# Patient Record
Sex: Female | Born: 1980
Health system: Southern US, Community
[De-identification: ages and names within clinical notes are randomized; demographics above are authoritative.]

## PROBLEM LIST (undated history)

## (undated) DIAGNOSIS — R32 Unspecified urinary incontinence: Secondary | ICD-10-CM

## (undated) DIAGNOSIS — I471 Supraventricular tachycardia, unspecified: Secondary | ICD-10-CM

## (undated) DIAGNOSIS — R000 Tachycardia, unspecified: Secondary | ICD-10-CM

## (undated) DIAGNOSIS — K589 Irritable bowel syndrome without diarrhea: Secondary | ICD-10-CM

## (undated) DIAGNOSIS — K900 Celiac disease: Secondary | ICD-10-CM

## (undated) DIAGNOSIS — T7840XA Allergy, unspecified, initial encounter: Secondary | ICD-10-CM

## (undated) DIAGNOSIS — B2790 Infectious mononucleosis, unspecified without complication: Secondary | ICD-10-CM

## (undated) DIAGNOSIS — E7889 Other lipoprotein metabolism disorders: Secondary | ICD-10-CM

## (undated) DIAGNOSIS — F419 Anxiety disorder, unspecified: Secondary | ICD-10-CM

## (undated) DIAGNOSIS — M26609 Unspecified temporomandibular joint disorder, unspecified side: Secondary | ICD-10-CM

## (undated) HISTORY — DX: Tachycardia, unspecified: R00.0

## (undated) HISTORY — DX: Other lipoprotein metabolism disorders: E78.89

## (undated) HISTORY — DX: Supraventricular tachycardia, unspecified: I47.10

## (undated) HISTORY — PX: TOOTH EXTRACTION: SUR596

## (undated) HISTORY — PX: LAPAROSCOPY: SHX197

## (undated) HISTORY — DX: Infectious mononucleosis, unspecified without complication: B27.90

## (undated) HISTORY — PX: ABDOMINOPLASTY: SUR9

## (undated) HISTORY — PX: COSMETIC SURGERY: SHX468

## (undated) HISTORY — DX: Supraventricular tachycardia: I47.1

## (undated) HISTORY — DX: Allergy, unspecified, initial encounter: T78.40XA

## (undated) HISTORY — DX: Unspecified urinary incontinence: R32

## (undated) HISTORY — DX: Unspecified temporomandibular joint disorder, unspecified side: M26.609

## (undated) HISTORY — DX: Irritable bowel syndrome, unspecified: K58.9

## (undated) HISTORY — DX: Anxiety disorder, unspecified: F41.9

## (undated) HISTORY — DX: Celiac disease: K90.0

---
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
## 1998-08-27 ENCOUNTER — Encounter: Payer: Self-pay | Admitting: Emergency Medicine

## 1998-08-27 ENCOUNTER — Emergency Department (HOSPITAL_COMMUNITY): Admission: EM | Admit: 1998-08-27 | Discharge: 1998-08-27 | Payer: Self-pay | Admitting: Emergency Medicine

## 2000-07-01 ENCOUNTER — Other Ambulatory Visit: Admission: RE | Admit: 2000-07-01 | Discharge: 2000-07-01 | Payer: Self-pay | Admitting: *Deleted

## 2000-09-20 ENCOUNTER — Emergency Department (HOSPITAL_COMMUNITY): Admission: EM | Admit: 2000-09-20 | Discharge: 2000-09-21 | Payer: Self-pay | Admitting: Emergency Medicine

## 2000-10-22 ENCOUNTER — Encounter (INDEPENDENT_AMBULATORY_CARE_PROVIDER_SITE_OTHER): Payer: Self-pay

## 2000-10-23 ENCOUNTER — Inpatient Hospital Stay (HOSPITAL_COMMUNITY): Admission: RE | Admit: 2000-10-23 | Discharge: 2000-10-24 | Payer: Self-pay | Admitting: *Deleted

## 2001-01-20 ENCOUNTER — Ambulatory Visit (HOSPITAL_COMMUNITY): Admission: RE | Admit: 2001-01-20 | Discharge: 2001-01-20 | Payer: Self-pay | Admitting: Gastroenterology

## 2007-07-21 ENCOUNTER — Other Ambulatory Visit: Admission: RE | Admit: 2007-07-21 | Discharge: 2007-07-21 | Payer: Self-pay | Admitting: Internal Medicine

## 2007-11-25 ENCOUNTER — Ambulatory Visit (HOSPITAL_COMMUNITY): Admission: RE | Admit: 2007-11-25 | Discharge: 2007-11-25 | Payer: Self-pay | Admitting: Obstetrics and Gynecology

## 2007-11-25 ENCOUNTER — Encounter (INDEPENDENT_AMBULATORY_CARE_PROVIDER_SITE_OTHER): Payer: Self-pay | Admitting: Obstetrics and Gynecology

## 2008-12-14 ENCOUNTER — Inpatient Hospital Stay (HOSPITAL_COMMUNITY): Admission: AD | Admit: 2008-12-14 | Discharge: 2008-12-14 | Payer: Self-pay | Admitting: Obstetrics and Gynecology

## 2008-12-15 ENCOUNTER — Inpatient Hospital Stay (HOSPITAL_COMMUNITY): Admission: AD | Admit: 2008-12-15 | Discharge: 2008-12-19 | Payer: Self-pay | Admitting: Obstetrics and Gynecology

## 2010-10-08 ENCOUNTER — Encounter
Admission: RE | Admit: 2010-10-08 | Discharge: 2010-12-18 | Payer: Self-pay | Source: Home / Self Care | Attending: Obstetrics and Gynecology | Admitting: Obstetrics and Gynecology

## 2011-03-04 LAB — COMPREHENSIVE METABOLIC PANEL
ALT: 20 U/L (ref 0–35)
AST: 21 U/L (ref 0–37)
Albumin: 2.7 g/dL — ABNORMAL LOW (ref 3.5–5.2)
Albumin: 2.8 g/dL — ABNORMAL LOW (ref 3.5–5.2)
Alkaline Phosphatase: 91 U/L (ref 39–117)
Alkaline Phosphatase: 99 U/L (ref 39–117)
BUN: 9 mg/dL (ref 6–23)
BUN: 9 mg/dL (ref 6–23)
CO2: 23 mEq/L (ref 19–32)
Calcium: 9 mg/dL (ref 8.4–10.5)
Chloride: 104 mEq/L (ref 96–112)
Creatinine, Ser: 0.47 mg/dL (ref 0.4–1.2)
Creatinine, Ser: 0.59 mg/dL (ref 0.4–1.2)
GFR calc Af Amer: >60 mL/min (ref >60)
GFR calc non Af Amer: >60 mL/min (ref >60)
Glucose, Bld: 111 mg/dL — ABNORMAL HIGH (ref 70–99)
Glucose, Bld: 70 mg/dL (ref 70–99)
Potassium: 3.7 mEq/L (ref 3.5–5.1)
Sodium: 135 mEq/L (ref 135–145)
Total Bilirubin: 0.2 mg/dL — ABNORMAL LOW (ref 0.3–1.2)
Total Bilirubin: 0.5 mg/dL (ref 0.3–1.2)
Total Protein: 5.8 g/dL — ABNORMAL LOW (ref 6.0–8.3)
Total Protein: 6.2 g/dL (ref 6.0–8.3)

## 2011-03-04 LAB — CBC
HCT: 37 % (ref 36.0–46.0)
HCT: 38.9 % (ref 36.0–46.0)
Hemoglobin: 10.4 g/dL — ABNORMAL LOW (ref 12.0–15.0)
Hemoglobin: 12.3 g/dL (ref 12.0–15.0)
Hemoglobin: 12.5 g/dL (ref 12.0–15.0)
Hemoglobin: 13.1 g/dL (ref 12.0–15.0)
MCHC: 33.5 g/dL (ref 30.0–36.0)
MCHC: 33.8 g/dL (ref 30.0–36.0)
MCHC: 33.8 g/dL (ref 30.0–36.0)
MCV: 92.1 fL (ref 78.0–100.0)
MCV: 92.1 fL (ref 78.0–100.0)
Platelets: 202 10*3/uL (ref 150–400)
Platelets: 230 10*3/uL (ref 150–400)
RBC: 3.33 MIL/uL — ABNORMAL LOW (ref 3.87–5.11)
RBC: 4.22 MIL/uL (ref 3.87–5.11)
RDW: 14.1 % (ref 11.5–15.5)
RDW: 14.1 % (ref 11.5–15.5)
RDW: 14.3 % (ref 11.5–15.5)
WBC: 12.6 10*3/uL — ABNORMAL HIGH (ref 4.0–10.5)
WBC: 8.9 10*3/uL (ref 4.0–10.5)

## 2011-03-04 LAB — LACTATE DEHYDROGENASE
LDH: 105 U/L (ref 94–250)
LDH: 135 U/L (ref 94–250)

## 2011-03-04 LAB — URIC ACID
Uric Acid, Serum: 4.6 mg/dL (ref 2.4–7.0)
Uric Acid, Serum: 5 mg/dL (ref 2.4–7.0)

## 2011-03-04 LAB — RPR: RPR Ser Ql: NONREACTIVE

## 2011-04-02 ENCOUNTER — Inpatient Hospital Stay (HOSPITAL_COMMUNITY)
Admission: AD | Admit: 2011-04-02 | Discharge: 2011-04-02 | Disposition: A | Discharge status: Home / Self Care | Payer: 59 | Source: Intra-hospital | Attending: Obstetrics and Gynecology | Admitting: Obstetrics and Gynecology

## 2011-04-02 DIAGNOSIS — O99891 Other specified diseases and conditions complicating pregnancy: Secondary | ICD-10-CM | POA: Insufficient documentation

## 2011-04-02 DIAGNOSIS — R000 Tachycardia, unspecified: Secondary | ICD-10-CM | POA: Insufficient documentation

## 2011-04-02 LAB — COMPREHENSIVE METABOLIC PANEL
ALT: 10 U/L (ref 0–35)
AST: 15 U/L (ref 0–37)
Alkaline Phosphatase: 82 U/L (ref 39–117)
CO2: 24 mEq/L (ref 19–32)
Chloride: 99 mEq/L (ref 96–112)
Glucose, Bld: 95 mg/dL (ref 70–99)
Potassium: 3.9 mEq/L (ref 3.5–5.1)
Sodium: 135 mEq/L (ref 135–145)
Total Bilirubin: 0.2 mg/dL — ABNORMAL LOW (ref 0.3–1.2)

## 2011-04-02 LAB — CBC
Hemoglobin: 12.7 g/dL (ref 12.0–15.0)
MCH: 30.5 pg (ref 26.0–34.0)
MCHC: 33.2 g/dL (ref 30.0–36.0)
Platelets: 173 10*3/uL (ref 150–400)
RDW: 13.7 % (ref 11.5–15.5)

## 2011-04-02 LAB — TSH: TSH: 1.747 u[IU]/mL (ref 0.350–4.500)

## 2011-04-02 LAB — URIC ACID: Uric Acid, Serum: 4.5 mg/dL (ref 2.4–7.0)

## 2011-04-02 NOTE — Discharge Summary (Signed)
NAMEJATAYA, WANN                ACCOUNT NO.:  [id number]   MEDICAL RECORD NO.:  [id number]          PATIENT TYPE:  INP   LOCATION:  9101                          FACILITY:  [place]   PHYSICIAN:  Daleen Bo. Gaetano Net, M.D. DATE OF BIRTH:  [birth year]   DATE OF ADMISSION:  12/15/2008  DATE OF DISCHARGE:  12/19/2008                               DISCHARGE SUMMARY   ADMITTING DIAGNOSES:  1. Intrauterine pregnancy at term.  2. Pregnancy-induced hypertension.  3. Induction of labor.   DISCHARGE DIAGNOSES:  1. Status post low transverse cesarean section secondary to failure to      progress.  2. Viable female infant.   PROCEDURE:  Primary low transverse cesarean section.   REASON FOR ADMISSION:  Please see dictated H and [place]:  The patient is 30 year old, gravida 2, para 0 that was  admitted to [facility] for an induction of labor.  The  patient had been seen in the office.  Her blood pressure was 134/90.  [place] labs were within normal limits.  The patient was complaining of a  headache and decision was made to admit the patient for an induction of  labor.  On admission, vital signs were stable.  Blood pressure 140/80.  Deep tendon reflexes are 2+, no clonus.  The patient was given Cytotec  for softening of her cervix and Pitocin was started the following  morning.  The patient had been in a good pattern of labor.  The  following morning, she did receive epidural for her comfort.  That  evening, cervix was 2 cm, 90% effaced with vertex revealing some edema  at -1 station.  Given that, there had not been any change in cervix in  greater than 9 hours.  Decision was made to proceed with a cesarean  delivery.  The patient was then transferred to the operating room where  epidural was dosed to an adequate surgical level.  A low transverse  incision was made with delivery of a viable female infant, weighing 8  pounds with Apgars of 9 at 1 minute and 9 at 5 minutes.   The patient  tolerated the procedure well and was taken to the recovery room in  stable condition.  In postoperative day #1, the patient was without  complaint.  Vital signs were stable.  She was afebrile.  Abdomen was  soft.  Fundus firm and nontender.  Abdominal dressings was noted to be  clean, dry, and intact.  Laboratory findings revealed a hemoglobin of  12.3, platelet count of 197,000, and WBC of 13.8.  On postoperative day  #2, the patient was without complaint.  Vital signs remained stable.  She is afebrile.  Fundus firm and nontender.  Abdominal dressing had  been removed to revealing an incision that was clean, dry, and intact.  Laboratory findings showed hemoglobin of 10.4, platelet count 176,000,  and WBC count of 12.6.  On postoperative day #3, the patient was without  complaint.  Vital signs remained stable.  She was afebrile.  Fundus firm  and nontender.  Incision was clean,  dry, and intact.  Staples removed.  The patient was later discharged home.   CONDITION ON DISCHARGE:  Stable.   DIET:  Regular as tolerated.   ACTIVITY:  No heavy lifting, no driving x2 weeks, and no vaginal entry.   FOLLOWUP:  The patient is to follow up in the office in 1-2 weeks for an  incision check.  She is to call for temperature greater than 100  degrees, persistent nausea, vomiting, heavy vaginal bleeding and/or  redness, or drainage from the incisional site.   DISCHARGE MEDICATIONS:  1. Tylox #30 one p.o. every 4-6 hours p.r.n.  2. Motrin 600 mg every 6 hours.  3. Prenatal vitamins 1 p.o. daily.  4. Colace 1 p.o. daily p.r.n.      Juanda Chance, N.P.      Daleen Bo. Gaetano Net, M.D.  Electronically Signed    CC/MEDQ  D:  12/19/2008  T:  12/19/2008  Job:  [id number]

## 2011-04-02 NOTE — Op Note (Signed)
[name], Terry [name]                ACCOUNT NO.:  [id number]   MEDICAL RECORD NO.:  [id number]          PATIENT TYPE:  INP   LOCATION:  9101                          FACILITY:  [place]   PHYSICIAN:  Michelle L. Grewal, M.D.DATE OF BIRTH:  February 06, 1981   DATE OF PROCEDURE:  DATE OF DISCHARGE:                               OPERATIVE REPORT   PREOPERATIVE DIAGNOSES:  Intrauterine pregnancy at term and failure to  progress.   POSTOPERATIVE DIAGNOSES:  Intrauterine pregnancy at term and failure to  progress.   PROCEDURE:  Primary low transverse cesarean section.   SURGEON:  Michelle L. Helane Rima, M.D.   ANESTHESIA:  Epidural.   FINDINGS:  Female infant, cephalic presentation Apgars 9 at 1 minute and 9  at 5 minutes.   ESTIMATED BLOOD LOSS:  500 mL.   COMPLICATIONS:  None.   PROCEDURE:  The patient was taken to the operating room.  The patient  was prepped and draped after her epidural was dosed.  A Foley catheter  was already and had been inserted while on labor and delivery.  Low  transverse incision was made, carried down to the fascia.  Fascia scored  in the midline and extended laterally.  The rectus muscles were  separated in the midline.  The peritoneum was entered bluntly.  The  peritoneal incision was then stretched.  The bladder blade was inserted.  The lower uterine segment was identified and the bladder flap was  created sharply and then digitally.  The bladder blade was readjusted.  A low transverse incision was made in the uterus.  The uterus was  entered using a hemostat.  The baby was in cephalic presentation and was  in occiput posterior position.  We delivered easily with a vacuum  extractor was a female infant Apgars 9 at 1 minute and 9 at 5 minutes.  The cord was clamped and cut the baby was handed to the awaiting  pediatricians and taken to the nursery.  The placenta was manually  removed, noted be normal intact with three-vessel cord.  Uterus was  exteriorized and  cleared of all clots and debris.  The uterine incision  was closed in two layers using 0-Chromic in a running locked stitch.  Hemostasis was excellent.  The uterus was returned to the abdomen.  Irrigation was performed.  Hemostasis was again noted.  The peritoneum  was closed using 0-Vicryl and running stitch.  The rectus muscles were  reapproximated using 0-Vicryl and running stitch.  After irrigation of  the subcutaneous layer, the skin was closed with staples.  All sponge,  lap, and instrument counts were correct x2.  The patient went to  recovery room in stable condition.     Michelle L. Helane Rima, M.D.  Electronically Signed    MLG/MEDQ  D:  12/16/2008  T:  12/17/2008  Job:  [id number]

## 2011-04-02 NOTE — H&P (Signed)
NAMEMARIANNA, CID NO.:  [id number]   MEDICAL RECORD NO.:  [id number]          PATIENT TYPE:  INP   LOCATION:  9173                          FACILITY:  [facility]   PHYSICIAN:  Darlyn Chamber, M.D.   DATE OF BIRTH:  Nov 07, 1981   DATE OF ADMISSION:  12/15/2008  DATE OF DISCHARGE:                              HISTORY & PHYSICAL   The patient is a 30 year old gravida 2, para 0 female.  Last menstrual  period of Terry [name] 29, 2009, with an estimated date confinement of December 21, 2008, and an estimated gestational at 73 weeks and 1/7th.  She was  seen in the office yesterday.  Her blood pressure 134/90, was sent to  triage.  Her PIH panel was normal.  She was monitored.  The baby was  reactive.  Blood pressure seemed to settle down.  She presented back  today complaining of worsening headaches.  Her blood pressure is 140/80  with a trace of protein.  In view of the worsening headache since an  elevated blood pressure near-term, decision was to bring in for Cytotec  induction of labor.  Her group B strep is negative.   ALLERGIES:  No known drug allergies.   MEDICATIONS:  Prenatal vitamins.   PRENATAL LABS:  An O positive, negative antibody screen, nonreactive  serology, immune rubella titer.  Negative hepatitis B surface antigen,  nonreactive HIV.   For past medical history, family history, and social history, please see  prenatal records.   REVIEW OF SYSTEMS:  Noncontributory.   PHYSICAL EXAMINATION:  VITAL SIGNS:  The patient's blood pressure  140/80.  Other vital signs are stable.  LUNGS:  Clear.  CARDIAC SYSTEM:  Regular rhythm rate, grade 2/6 systolic ejection  murmur.  ABDOMEN:  Gravid.  Uterus consistent with dates.  PELVIC:  Cervix is long and closed.  Vertex is presenting.  Pelvimetry  is adequate.  EXTREMITIES:  Deep tendon reflexes 2+.  No clonus.  Revealed 2+ to 3+  edema.   Ultrasound revealed intrauterine pregnancy with an estimated fetal  weight  of 8 pounds 5 ounces.  Amniotic fluid was 15.7 cm, 63rd  percentile, and foot was vertex.   IMPRESSION:  Intrauterine pregnancy at 39 plus weeks with pregnancy-  induced hypertension.   PLAN:  The patient undergo Cytotec ripening of cervix and induction.  Risk of induction have been discussed.     Darlyn Chamber, M.D.  Electronically Signed    JSM/MEDQ  D:  12/15/2008  T:  12/16/2008  Job:  [id number]

## 2011-04-02 NOTE — Op Note (Signed)
Terry [name], DARIS                ACCOUNT NO.:  [id number]   MEDICAL RECORD NO.:  [id number]          PATIENT TYPE:  AMB   LOCATION:  [place]                           FACILITY:  [place]   PHYSICIAN:  Daleen Bo. Gaetano Net, M.D. DATE OF BIRTH:  1981/05/06   DATE OF PROCEDURE:  11/25/2007  DATE OF DISCHARGE:                               OPERATIVE REPORT   PREOPERATIVE DIAGNOSIS:  Spontaneous abortion.   POSTOPERATIVE DIAGNOSIS:  Spontaneous abortion.   PROCEDURE:  1. Dilatation evacuation.  2. 1% Xylocaine paracervical block.   SURGEON:  Everlene Farrier, MD   ANESTHESIA:  MAC.   SPECIMENS:  Products of conception to pathology.   ESTIMATED BLOOD LOSS:  Minimal.   INDICATIONS AND CONSENT:  The patient is a 30 year old married white  female G1 P0 with a quantitative HCG that went from approximately 42,000-  43,000.  Ultrasound x2 has revealed an intrauterine gestational sac with  the yolk sac but no fetal pole.  Diagnosis of spontaneous abortion is  made.  Options were discussed and dilatation evacuation is reviewed.  Potential risks and complications were reviewed preoperatively including  but limited to infection, uterine perforation, organ damage, bleeding  requiring transfusion of blood products with possible HIV and hepatitis  acquisition, intrauterine synechia, secondary infertility, hysterectomy.  All questions were answered and consent is signed on the chart.   PROCEDURE:  The patient taken to operating room where she is placed in  dorsosupine position and given intravenous sedation.  She is then placed  in the dorsal lithotomy position where she is gently prepped and draped  in sterile fashion.  The patient had voided immediately prior to  transfer to the operating room.  Bivalve speculum is placed in the  vagina and anterior cervical lip was injected with 1% plain Xylocaine.  It was then grasped with a single-tooth tenaculum.  Paracervical block  was placed at 2, 4, 5, 7, 8 and  10 o'clock positions with approximately  20 mL of the same solution.  Cervix was gently progressively dilated to  29 dilator.  #7 curved curettes placed in the uterine cavity and suction  curettage is carried out for obvious products of conception.  After the  first pass of the suction curette 20 units of Pitocin were added to a  new bag of IV fluids.  Alternating sharp and suction curettage is  carried out until the cavity is clean.  Good hemostasis is noted.  All  instruments are removed and all counts correct.  The patient is  discharged home on Methergine 0.2 mg p.o. t.i.d. for six doses.  Blood  type is O+.  Follow-up in the office in 2 weeks.      Daleen Bo Gaetano Net, M.D.  Electronically Signed     JET/MEDQ  D:  11/25/2007  T:  11/25/2007  Job:  [id number]

## 2011-04-05 ENCOUNTER — Encounter: Payer: Self-pay | Admitting: Internal Medicine

## 2011-04-05 NOTE — Op Note (Signed)
[facility]  Patient:    Terry [name], Terry [name]                     MRN: [id number] Proc. Date: 10/22/00 Adm. Date:  [id number] Attending:  Syble Creek B                           Operative Report  PREOPERATIVE DIAGNOSES:       1. Dysmenorrhea.                               2. Dyspareunia.                               3. Possible endometriosis.  POSTOPERATIVE DIAGNOSES:      Endometrioma involving right ovary and                               posterior cul-de-sac.  OPERATIONS:                   1.  Diagnostic laparoscopy.                               2.  Laparotomy.                               3.  Exploration of small and large intestine,                                   excision of endometriosis.  SURGEON:                      Syble Creek, M.D.  ASSISTANTS:                   Sheronette A. Cousins M.D.                               Lovenia Kim, M.D.  INTRAOPERATIVE CONSULT:       Dr. Maia Plan. Lindon Romp, M.D., General Surgery.  ANESTHESIA:                   General.  ESTIMATED BLOOD LOSS:         150 cc.  IV FLUIDS:                    2500 cc.  URINE OUTPUT:                 300 cc.  COMPLICATIONS:                None.  DRAINS:                       Foley.  INDICATIONS:                  This 30 year old white female has a long history of bilateral lower quadrant pain, dysmenorrhea, dyspareunia. Leading diagnosis prior to surgery was endometriosis. Note: Patient was exquisitely tender in t he cul-de-sac.  She was taken to surgery for diagnostic laparoscopy for definitive diagnosis and possible treatment of endometriosis if diagnosed.  FINDINGS:                     1. On open laparoscopy there was concern for                                  bowel injury given a dark matter which                                  appeared  consistent with feces.                               2. On exploratory laparotomy, no injury to             large or small bowel, stomach, or upper                                  abdomen.                               3. Endometriosis of the cul-de-sac (excised) and                                  presumed right ovarian endometrioma.  DESCRIPTION OF PROCEDURE:     The patient was taken to operating room and general anesthesia obtained. She was placed in the ski position and prepped and draped in standard fashion. Foley catheter inserted into the bladder. Single-tooth tenaculum attached to the anterior lip of the cervix and the Corson cannula attached to the single-tooth.  Attention turned to the abdomen and a 10-mm vertical infraumbilical skin fold incision was begun with a knife. The underlying fascia was grasped with Kocher clamps and elevated. It was entered sharply with a knife and a pursestring suture was placed with 0 Vicryl. The peritoneum was identified, elevated with Allis clamps and entered sharply. Intra-abdominal placement was confirmed by palpation with an examining finger. The Hasson cannula was then inserted into the abdomen and secured to the previously placed pursestring suture.  On placement of the operative scope, intra-abdominal placement could not be confirmed visually. Therefore, the scope was removed and Hasson cannula removed. On the tip of Hasson cannula was a dark material that appeared consistent with feces. Also had odor consistent with feces. Given this, I was concerned for injury to the bowel. General surgery consultation was obtained.  With Dr. Ellwood Sayers assistance a vertical midline incision was extended inferiorly to just above the symphysis pubis. This was carried sharply to the underlying fascia. The fascia was divided with a knife. The underlying peritoneum was divided with the Bovie. Trendelenburg position was obtained. Balfour retractor inserted into the abdomen. The small bowel was run from ligament of Treitz to the terminal ileum. No  injuries noted. The colon was run from the cecum around to the sigmoid colon and rectum. No injuries were noted. Dr. Lindon Romp from general surgery was present for the examination of the bowel. He concurred there were no injuries visualized. Please see his intraoperative dictation  for further details from his perspective.  The bowel was then packed superiorly with moist packs and the pelvis inspected. The right ovary had similar-type material on the posterior surface as had previously been noted on the tip of the Hasson cannula. An area appeared to be consistent with a ruptured endometrioma was seen. This portion of the ovary was opened and no further cyst wall could be delineated. This area was closed with a running stitch of 3-0 Vicryl. Another small simple cyst had ruptured on manipulation of the ovary which was made hemostatic with a Bovie. The right tube, uterus, left tube, and ovary all appeared normal. In the posterior cul-de-sac an area consistent with endometriosis was seen. It was elevated with long Allis clamps and this peritoneal excised sharply. It was approximately 3-cm long x 3-cm wide area. This was then closed with a running stitch of 3-0 Vicryl. Hemostasis obtained. Pelvis was re-irrigated and was hemostatic. Packs were removed from the abdomen and bowel returned to an anatomic position.  The fascia was closed in a running stitch of #1 PDS. The subcutaneous tissue was irrigated, made hemostatic with a Bovie. The skin was closed in a running subcuticular stitch of 4-0 Vicryl and covered with Steri-Strips.  The instruments were removed from the vagina and the anterior lip of the cervix was noted to be bleeding. It was made hemostatic with a single figure-of-eight suture of 3-0 chromic. The patient tolerated the procedure well with no known complications. She was taken to recovery room awake, alert, and is stable condition. DD:  10/22/00 TD:  10/22/00 Job:  [id number]

## 2011-04-05 NOTE — Procedures (Signed)
[name]  Patient:    Terry [name], STROHM Visit Number: [id number] MRN: [id number]          Service Type: GYN Location: [phone] Attending Physician:  Shelah Lewandowsky Proc. Date: 01/20/01 Admit Date:  10/22/2000 Discharge Date: 10/24/2000   CC:         Syble Creek, M.D., [facility]                           Procedure Report  DATE OF BIRTH:  Sep 25, 1981.  PROCEDURE:  Flexible sigmoidoscopy up to 80 cm.  ENDOSCOPIST:  Nelwyn Salisbury, M.D.  INSTRUMENT USED:  Olympus video colonoscope.  INDICATION FOR PROCEDURE:  Left lower quadrant pain and a history of IBS in a 29 year old white female, who underwent a recent laparoscopic evaluation which had to be done through open procedure because of questionable stool on the trocar and the question of a perforated diverticulum.  Rule out diverticulosis.  PREPROCEDURE PREPARATION:  Informed consent was procured from the patient. The patient was fasted for eight hours prior to the procedure and prepped with two Fleets enemas the morning of the procedure.  PREPROCEDURE PHYSICAL:  VITAL SIGNS:  The patient had stable vital signs.  NECK:  Supple.  CHEST:  Clear to auscultation.  S1, S2 regular.  No murmur, rub, or gallop, rales, rhonchi, or wheezing.  ABDOMEN:  Soft with normal abdominal bowel sounds.  DESCRIPTION OF PROCEDURE:  The patient was placed in the left lateral decubitus position and sedated with 100 mg of Demerol and 10 mg of Versed intravenously.  Once the patient was adequately sedate and maintained on low-flow oxygen and continuous cardiac monitoring, the Olympus video colonoscope was advanced from the rectum to about 80 cm without difficulty. The patient tolerated the procedure well without complication.  No masses, polyps, erosions, ulcerations, or diverticulosis were seen.  IMPRESSION:  Normal flexible sigmoidoscopy up to 80 cm.  RECOMMENDATIONS: 1. The patient is  advised to continue increasing the fluid and fiber in her    diet and use Levsin on a p.r.n. basis for spasm. 2. Outpatient follow-up is advised in the next four weeks. Attending Physician:  Syble Creek B DD:  01/20/01 TD:  01/20/01 Job: [id number]

## 2011-04-05 NOTE — Discharge Summary (Signed)
[facility]  Patient:    Terry [name], Terry [name] Visit Number: [id number] MRN: [id number]          Service Type: GYN Location: [phone] Attending Physician:  Shelah Lewandowsky Admit Date:  10/22/2000 Discharge Date: 10/24/2000                             Discharge Summary  ADMISSION DIAGNOSES:          1. Bilateral lower quadrant abdominal                                  pain.                               2. Dyspareunia.                               3. Dysmenorrhea.  DISCHARGE DIAGNOSES:          Probable ruptured diverticulum in the remote past with subsequent abdominal pain.  HISTORY OF PRESENT ILLNESS:   For complete details, please see the history and physical in the chart. Briefly, the patient presented with a two-and-a-half year history of bilateral lower quadrant pain increasing in severity. Had been worse on the left side than the right. This had been premenstrual as well as with intercourse. She also had associated dysmenorrhea. Patient presented for diagnostic laparoscopy, potential treatment of endometriosis.  HOSPITAL COURSE:              On day of admission, patient underwent diagnostic laparoscopy. On entry to the abdomen, there was concern for bowel injury due to the presence of what appeared to be fecal material.  Therefore, the abdomen was opened and explored with Dr. Lindon Romp, of general surgery, appeared no bowel injury could be identified. There was a brownish material that did appear to be consistent with stool. This was submitted to pathology. In addition, an area of the posterior cul-de-sac that was thickened and inflamed was submitted as well. Otherwise, the patients surgery was unremarkable and she was stable throughout.  Postoperatively, patient regained her ability to ambulate and void rapidly. She did have some postoperative nausea on the first day and a mild ileus. However, by the second postoperative day she was  passing flatus and tolerating regular diet. In addition, she remained afebrile with stable vital signs. She was discharged on second postoperative day in satisfactory condition.  Of note, on the day of discharge, I was notified by Dr. Marval Regal of pathology of his observations from the submitted specimens. First, the presumed fecal material was confirmed to be just that. By his report it appeared to be old fecal material due to the fact that there were hemosiderin deposits noted within the material which would not be consistent with a fresh stool specimen. The area of peritoneum that was excised from the cul-de-sac simply showed some change associated with inflammation and hemosiderin. His presumptive diagnosis from the material submitted would be that of an old perforated diverticulum which had since healed. No evidence of endometriosis was seen by pathology.  DISCHARGE INSTRUCTIONS:       Patient instructed to avoid heavy lifting; no driving for two weeks. Report nausea, vomiting, abdominal pain, or pain medicine not working. Also watch  incisions for signs of infection.  FOLLOWUP:                     Follow up [place] in four weeks. At that time I will refer patient to gastroenterologist for further recommendations regarding her diagnosis of diverticulosis. In the meantime patient is instructed to avoid those types of foods that could be irritating to diverticulosis including peas, seeds, nuts, etc. Also instructed patient to avoid constipation by using Metamucil daily.  DISCHARGE MEDICATIONS:        1. Tylox one to two tablets every four to six                                  hours as needed for pain.                               2. Ortho Tri-Cyclen for contraception. Patient                                  instructed to start this after her next                                  menstrual period. Attending Physician:  Syble Creek B DD:  10/24/00 TD:  10/25/00 Job:  [id number]

## 2011-04-05 NOTE — Consult Note (Signed)
[facility]  Patient:    Terry [name], Terry [name]                     MRN: [id number] Adm. Date:  [id number] Disc. Date: [id number] Attending:  Maudry Diego                          Consultation Report  I was called into the operating room by Dr. Rosana Hoes because of a specimen seen at the tip of a Hasson cannula which had a slightly stoolish appearance.  He had already started a laparotomy in the midline and this was extended superiorly and inferiorly.  There was some blood in the pelvis and this was sucked dry.  We immediately saw some endometriosis, particularly on the right ovary and the right mesovarium and some endometriosis in the cul-de-sac.  The small intestine was run from the ligament of Treitz to the bloodless _____________.  The duodenum appeared intact. There was no bleeding or bile staining in the mesentery.  The anterior surface of the stomach and duodenum felt normal.  The rectum, sigmoid colon, ascending colon, transverse colon, descending colon and cecum all appeared normal.  The appendix was intact and without signs of inflammation.  Dr. Rosana Hoes and I and his assistant examined the intestine several times until we were satisfied that we could find no perforation or any evidence of perforation.  We believe that the specimen represents old blood from the endometrioma.  The patient was stable when I left the operating room and Dr. Rosana Hoes was continuing with his procedure. DD:  10/22/00 TD:  10/22/00 Job: [id number]

## 2011-04-05 NOTE — H&P (Signed)
[place]  Patient:    Terry [name], SCHLICHTING                       MRN: [id number] Adm. Date:  10/22/00 Attending:  Dr. Syble Creek Dictator:   WD                         History and Physical  DATE OF BIRTH:  02-11-1981  ANTICIPATED ADMISSION DATE:   October 22, 2000  ADMISSION DIAGNOSES:          1.  Bilateral lower quadrant abdominal                                   pain.                               2.  Dyspareunia.                               3.  Dysmenorrhea.                               4.  Possible endometriosis.  HISTORY OF PRESENT ILLNESS:   Ms. Luberta Mutter is a 30 year old white female, gravida 0, who presents with a 2-1/2 year history of bilateral lower quadrant pain which has been increasing in severity.  It occurs primarily just premenstrual as well as with intercourse.  Her menses are also painful and she says she has cramps, however, not as bad as after intercourse or just prior to menses.  She has been treated in the past for irritable bowel disease with Levsin and Lorcet, however, this has not relieved her symptoms.  A few weeks ago she was seen at [facility] for increasing severe abdominal pain.  No other diagnosis have been rendered at this time.  The patient is presenting for a definitive diagnosis of her bilateral lower abdominal pain and associated dysmenorrhea and dyspareunia as well as potential treatment of endometriosis if identified.  PAST MEDICAL HISTORY:         Irritable bowel syndrome, migraines, mononucleosis, and TMJ.  PAST SURGICAL HISTORY:        Wisdom teeth.  GYN HISTORY:                  Last pap smear 8/01 within normal limits.  MEDICATIONS:                  1.  Prevacid.                               2.  Lorcet.  ALLERGIES:                    None.  SOCIAL HISTORY:               The patient is a nonsmoker; occasional alcohol. No history of domestic violence.  The patient is single  and lives with her parents and works as a Corporate treasurer.  FAMILY HISTORY:               Significant for hypertension and cardiovascular disease in parents and  grandparents.  REVIEW OF SYSTEMS:            CONSTITUTIONAL:  Constitutional history of fainting spell or near syncopal episode associated with pelvic pain recently. HEENT:  Eyes are negative.  No problems with ears or hearing.  She does have occasional nose bleeds prior to menses.  BACK:  She has pain in her back below the scapula bilaterally.  CARDIOVASCULAR:  Within normal limits.  RESPIRATORY:  No cough or shortness of breath.  GASTROINTESTINAL:  No nausea, vomiting, constipation, blood in stools or diarrhea.  She does have occasional heart burn.  GENITOURINARY:  See HPI.  MUSCULOSKELETAL:  No severe joint or muscle pains.  SKIN/BREASTS:  No new lesions or breast masses.  NEUROLOGICAL:  She has a history of migraine headaches.  PSYCHIATRIC:  No work or family problems, no history of domestic violence, or history of sexual assault. ENDOCRINE:  No hot flashes, thyroid disease, or diabetes.  HEMATOLOGIC:  No unexplained bruising or easy bleeding.  PHYSICAL EXAMINATION:  VITAL SIGNS:                  Blood pressure 120/82.  Pulse 102.  Weight: 123.  Height:  57.5".  GENERAL:                      Generally, the patient is alert and oriented and in no acute distress.  SKIN:                         Skin is warm and dry with no lesions.  CARDIAC:                      Heart is regular rate and rhythm.  LUNGS:                        The lungs are clear to auscultation.  ABDOMEN:                      Naval ring noted.  No hernia.  Liver and spleen is normal.  Mild bilateral lower quadrant tenderness.  LYMPHATICS:                   Lymph node survey is negative.  Axilla, neck groin, pelvic exam was performed on 10/03/00 by Dr. Georgana Curio and not repeated prior to surgery.  GYNECOLOGIC:                  Per Dr. Berneta Sages,  normal external female genitalia. Vagina and cervix is normal.  The uterus is a normal size. Anteriorly, no adnexal masses or tenderness.  GENITORECTAL:                 The urethra, bladder base, and anus are normal. Rectovaginal exam shows exquisite tenderness in the cul-de-sac.  ASSESSMENT:                   1.  Bilateral lower quadrant pain.                               2.  Dysmenorrhea.                               3.  Dyspareunia.  4.  Reading diagnosis is endometriosis.  PLAN:                         Laparoscopy for possible ways for vaporization of endometriosis lesions.  It was explained to the patient that there is a possibility that no pathological diagnosis will be seen.  Also it was explained to the patient that severe endometriosis may be found which could not be addressed via laparoscopy and there may be a need for additional future surgery.  Operative risks were discussed including infection, bleeding, damage to bowel,  bladder or surrounding organs.  All questions were answered.  No guarantees given.  Arrangements have been made for surgery on December 5th at [facility]. DD:  10/16/00 TD:  10/16/00 Job: [id number]

## 2011-04-05 NOTE — Op Note (Signed)
[facility]  Patient:    Terry [name]                      MRN: [id number] Proc. Date: 10/22/00 Attending:  Frederico Hamman, M.D.                           Operative Report  PREOPERATIVE DIAGNOSIS:       Multiparity.  PROCEDURE:                    Postpartum tubal ligation.  SURGEON:                      Frederico Hamman, M.D.  DESCRIPTION OF PROCEDURE:     Using MAC anesthesia, with the patient in the supine position, the abdomen was prepped and draped.  The bladder was emptied with a straight catheter.  A midline subumbilical incision one inch long was made and carried down through the fascia.  The fascia was clean.  Grasped with two Kochers, and the fascia of the peritoneum opened.  The left tube was grasped in the midportion with a Babcock clamp.  The tube was traced to the fimbria.  A #0 plain suture was placed in the mesosalpinx below the portion of the tube that was clamped, and the tube tied, and approximately one inch of tube transected.  Hemostasis was satisfactory.  We proceeded in a similar fashion on the other side.  The abdomen was closed in layers.  The peritoneum and fascia closed with a continuous suture of #0 Dexon.  The skin was closed with subcuticular suture of #3-0 plain.  The patient tolerated the procedure well and was taken to the recovery room in good condition. DD:  10/22/00 TD:  10/22/00 Job: [id number]

## 2011-04-08 ENCOUNTER — Ambulatory Visit (INDEPENDENT_AMBULATORY_CARE_PROVIDER_SITE_OTHER): Payer: 59 | Admitting: Internal Medicine

## 2011-04-08 ENCOUNTER — Institutional Professional Consult (permissible substitution): Payer: 59 | Admitting: Internal Medicine

## 2011-04-08 DIAGNOSIS — IMO0002 Reserved for concepts with insufficient information to code with codable children: Secondary | ICD-10-CM | POA: Insufficient documentation

## 2011-04-08 DIAGNOSIS — I498 Other specified cardiac arrhythmias: Secondary | ICD-10-CM

## 2011-04-08 DIAGNOSIS — G901 Familial dysautonomia [Riley-Day]: Secondary | ICD-10-CM

## 2011-04-08 DIAGNOSIS — Z789 Other specified health status: Secondary | ICD-10-CM

## 2011-04-08 DIAGNOSIS — G909 Disorder of the autonomic nervous system, unspecified: Secondary | ICD-10-CM

## 2011-04-08 DIAGNOSIS — I471 Supraventricular tachycardia, unspecified: Secondary | ICD-10-CM | POA: Insufficient documentation

## 2011-04-08 DIAGNOSIS — R000 Tachycardia, unspecified: Secondary | ICD-10-CM | POA: Insufficient documentation

## 2011-04-08 NOTE — Assessment & Plan Note (Signed)
As above.

## 2011-04-08 NOTE — Assessment & Plan Note (Signed)
The patient is having significant symptoms related to orthostatic intolerance in the setting of a long-standing history of orthostatic intolerance and dysautonomia. The third trimester with its large uterus is likely the precipitant of the worsening of her symptoms. Fluid depletion is aggravating. She's had problems with blood pressure at the terminal portions of her pregnancy and so salt repletion at this point is not appropriate. She is to be delivered next week. This will relieve the IVC "obstruction" and hopefully mitigate a lot of her symptoms. Volume repletion at the time of her delivery will be important. Continue her beta blockers at this point seems reasonable.  I have reviewed extensively the physiology of orthostatic intolerance with the patient. We will plan to see her again 6-8 weeks following delivery further review this in the commode longer-term plans.

## 2011-04-08 NOTE — Assessment & Plan Note (Signed)
>>  ASSESSMENT AND PLAN FOR SINUS TACHYCARDIA WRITTEN ON 04/08/2011 12:40 PM BY Duke Salvia, MD  As above

## 2011-04-08 NOTE — Progress Notes (Signed)
HPI: Terry [name] is a 30 y.o. female Is seen at the request of Dr. Gaetano Net because of tachycardia and presyncope occurring now in the third trimester of pregnancy.  She is a 30 year old woman with a long-standing history of orthostatic intolerance, shower intolerance, Jacuzzi intolerance who in the last trimester of her second pregnancy has had significant problems with presyncope. It has been accompanied by a prodrome of nausea flushing tinnitus and visual changes. It is relieved by lying down and exacerbated by standing.  Her diet is salt and water deplete.  She was started on atenolol a few weeks ago by her OB/GYN with a marked improvement in her heart rate having gone from recorded rates of 140s or so down to thw 110s.  I anticipate that her thyroid and her hemoglobin have all been checked and are normal. Current Outpatient Prescriptions  Medication Sig Dispense Refill  . acetaminophen (TYLENOL) 325 MG tablet Take 650 mg by mouth every 6 (six) hours as needed.        Marland Kitchen atenolol (TENORMIN) 25 MG tablet Take 25 mg by mouth daily.        . folic acid (FOLVITE) 030 MCG tablet Take 400 mcg by mouth daily.        . NON FORMULARY otc nasal spray mixed with saline solution as needed       . Prenatal Vit-Fe Sulfate-FA (PRENATAL VITAMIN PO) 1 tab po qd       . ranitidine (ZANTAC) 150 MG tablet       . TiZANidine HCl (ZANAFLEX PO) 1 tab po bid       . HYDROcodone-acetaminophen (VICODIN) 5-500 MG per tablet As needed        No Known Allergies  Past Medical History  Diagnosis Date  . Tachycardia   . Celiac disease   . IBS (irritable bowel syndrome)   . Migraine   . Mononucleosis   . TMJ disease     Past Surgical History  Procedure Date  . Cesarean section   . Laparoscopy   . Tooth extraction     Family History  Problem Relation Age of Onset  . Hypertension      History   Social History  . Marital Status: Married    Spouse Name: N/A    Number of Children: N/A  . Years of  Education: N/A   Occupational History  . Not on file.   Social History Main Topics  . Smoking status: Not on file  . Smokeless tobacco: Not on file  . Alcohol Use: Not on file  . Drug Use: Not on file  . Sexually Active: Not on file   Other Topics Concern  . Not on file   Social History Narrative  . No narrative on file    Fourteen point review of systems was negative except as noted in HPI and PMH   PHYSICAL EXAMINATION  Blood pressure 136/94, pulse 115.   Well developed and nourished Terry [name] Caucasian female appearing her stated agein no acute distress HENT normal Neck supple with JVP-flat Carotids brisk and full without bruits Back without scoliosis or kyphosis Clear Regular rate and rhythm, no murmurs or gallops Abd-gravid distal pulses intact No Clubbing cyanosis edema Skin-warm and dry LN-neg submandibular and supraclavicular A & Oriented CN 3-12 normal  Grossly normal sensory and motor function Affect engaging . Normal sinus rhythm at 103 Intervals 0.13/0.07/0.33 Axis is 30

## 2011-04-08 NOTE — Patient Instructions (Signed)
Your physician recommends that you schedule a follow-up appointment in: 2 months.  Your physician recommends that you continue on your current medications as directed. Please refer to the Current Medication list given to you today.

## 2011-04-09 ENCOUNTER — Encounter (HOSPITAL_COMMUNITY): Payer: 59

## 2011-04-09 ENCOUNTER — Other Ambulatory Visit: Payer: Self-pay | Admitting: Obstetrics and Gynecology

## 2011-04-09 ENCOUNTER — Telehealth: Payer: Self-pay | Admitting: Internal Medicine

## 2011-04-09 LAB — CBC
HCT: 39.1 % (ref 36.0–46.0)
MCHC: 33 g/dL (ref 30.0–36.0)
Platelets: 178 10*3/uL (ref 150–400)
RDW: 13.8 % (ref 11.5–15.5)
WBC: 7.9 10*3/uL (ref 4.0–10.5)

## 2011-04-09 LAB — SURGICAL PCR SCREEN: MRSA, PCR: NEGATIVE

## 2011-04-09 NOTE — Telephone Encounter (Signed)
LOV,12 faxed to [facility] @  [phone]  04/09/11/km

## 2011-04-11 ENCOUNTER — Telehealth: Payer: Self-pay | Admitting: *Deleted

## 2011-04-11 NOTE — Telephone Encounter (Signed)
See other comments on this phone note.

## 2011-04-12 ENCOUNTER — Telehealth: Payer: Self-pay | Admitting: Internal Medicine

## 2011-04-12 NOTE — Telephone Encounter (Signed)
Pt had appointment  on Monday- Dr. Caryl Comes was suppose to write a letter to [facility]. Pt having her baby on Tuesday. Deadline is today.

## 2011-04-12 NOTE — Telephone Encounter (Signed)
I spoke with the pt and per Nira Conn RN documentation on 04/11/11 the pt's 04/08/11 office note was faxed to [facility].  The pt requested that a copy of this office note be mailed to her home.  This was placed in the out going mail today.

## 2011-04-16 ENCOUNTER — Other Ambulatory Visit: Payer: Self-pay | Admitting: Obstetrics and Gynecology

## 2011-04-16 ENCOUNTER — Inpatient Hospital Stay (HOSPITAL_COMMUNITY)
Admission: RE | Admit: 2011-04-16 | Discharge: 2011-04-19 | DRG: 766 | Disposition: A | Discharge status: Home / Self Care | Payer: 59 | Source: Ambulatory Visit | Attending: Obstetrics and Gynecology | Admitting: Obstetrics and Gynecology

## 2011-04-16 DIAGNOSIS — Z01812 Encounter for preprocedural laboratory examination: Secondary | ICD-10-CM

## 2011-04-16 DIAGNOSIS — Z01818 Encounter for other preprocedural examination: Secondary | ICD-10-CM

## 2011-04-16 DIAGNOSIS — O34219 Maternal care for unspecified type scar from previous cesarean delivery: Principal | ICD-10-CM | POA: Diagnosis present

## 2011-04-16 DIAGNOSIS — O99892 Other specified diseases and conditions complicating childbirth: Secondary | ICD-10-CM | POA: Diagnosis present

## 2011-04-17 LAB — CBC
Hemoglobin: 10.4 g/dL — ABNORMAL LOW (ref 12.0–15.0)
MCH: 30.1 pg (ref 26.0–34.0)
MCHC: 32.6 g/dL (ref 30.0–36.0)
RDW: 14 % (ref 11.5–15.5)

## 2011-04-18 NOTE — Op Note (Signed)
Terry [name], Terry [name]                ACCOUNT NO.:  [id number]  MEDICAL RECORD NO.:  [id number]           PATIENT TYPE:  I  LOCATION:  9105                          FACILITY:  [place]  PHYSICIAN:  Daleen Bo. Gaetano Net, M.D. DATE OF BIRTH:  11-Sep-1981  DATE OF PROCEDURE:  04/16/2011 DATE OF DISCHARGE:                              OPERATIVE REPORT   PREOPERATIVE DIAGNOSES: 1. Intrauterine pregnancy at 39-1/7 weeks' estimated additional age at 30. 2. Previous cesarean section, desires repeat.  POSTOPERATIVE DIAGNOSIS: 1. Intrauterine pregnancy at 39-1/7 weeks' estimated additional age at 30. 2. Previous cesarean section, desires repeat.  PROCEDURE:  Repeat low transverse cesarean section.  SURGEON:  Daleen Bo. Gaetano Net, MD  ANESTHESIA:  Spinal.  SPECIMENS:  Placenta to pathology.  ESTIMATED BLOOD LOSS:  700 mL.  FINDINGS:  Viable female infant, Apgars of 8 and 9 at 1 and 5 minutes respectively.  Birth weight pending.  Intrauterine cord pH 7.39.  INDICATIONS AND CONSENT:  This patient is a 30 year old married white female G2, P1 with previous cesarean section at 39-1/7 weeks.  Prenatal care has been complicated by maternal tachycardia in the 140s.  This was treated with atenolol 25 mg daily.  Evaluation by Cardiology is consistent with a dysautonomic hyperactivity or dysautonomic syndrome. Recommendations are to continue atenolol now and through the postpartum period until postpartum Cardiology evaluation.  We will also maintain adequate hydration.  Potential risks and complications of the cesarean section had been discussed with the patient preoperatively including but limited to infection, organ damage, bleeding requiring transfusion of blood products with HIV and hepatitis acquisition, DVT, PE, and pneumonia.  All questions have been answered and consent was signed on the chart.  PROCEDURE:  The patient was taken to operating room where she is identified.  Spinal anesthetic is placed and she is  placed in dorsal supine position with a 50-degree left lateral wedge.  She is then prepped with Betadine.  Foley catheter was placed in the bladder to drain and she is draped in a sterile fashion.  After testing for adequate spinal anesthesia, skin is entered through a Pfannenstiel incision and the old scar is resected on the way in.  Dissection is carried out in layers to the peritoneum, which was taken down and extended superiorly and inferiorly.  Vesicouterine peritoneum was taken down cephalolaterally and the bladder flap was developed and the bladder blade was placed.  Uterus was incised in a low transverse manner and the uterine cavity was entered bluntly with a hemostat.  The uterine incision is extended cephalolaterally with the fingers.  Clear fluid was noted.  In order to elevate the vertex through the incision which contained some scarring from the original C-section, vacuum extractor was placed on the vertex.  With one gentle lift with no pop off, the vertex easily delivers.  Remainder of the baby is delivered.  Good cry and tone is noted.  Cord was clamped and cut.  The baby is handed to awaiting pediatrics team.  Placenta is manually delivered and sent to pathology.  Uterine cavity is clean.  Uterus is closed in 2 running locking imbricating layers of 0 Monocryl  suture.  Additional figure-of- eight was placed at the left angle and good hemostasis was noted.  Tubes and ovaries are normal bilaterally.  Anterior peritoneum was closed in a running fashion with 0 Monocryl suture, which was also used to reapproximate the pyramidalis muscle in midline.  Anterior rectus fascia was closed in running fashion with a 0 looped PDS suture.  Skin was closed with clips.  All sponge, instrument, and needle counts were correct and the patient was taken to recovery room in stable condition.     Daleen Bo Gaetano Net, M.D.     JET/MEDQ  D:  04/16/2011  T:  04/17/2011  Job:   [id number]  Electronically Signed by Everlene Farrier M.D. on 04/18/2011 01:15:39 PM

## 2011-04-21 ENCOUNTER — Inpatient Hospital Stay (HOSPITAL_COMMUNITY): Admission: AD | Admit: 2011-04-21 | Payer: Self-pay | Source: Home / Self Care | Admitting: Obstetrics and Gynecology

## 2011-05-14 NOTE — Discharge Summary (Signed)
Terry [name], Terry [name]                ACCOUNT NO.:  [id number]  MEDICAL RECORD NO.:  [id number]           PATIENT TYPE:  I  LOCATION:  9105                          FACILITY:  [facility]  PHYSICIAN:  Darlyn Chamber, M.D.   DATE OF BIRTH:  1981/04/09  DATE OF ADMISSION:  04/16/2011 DATE OF DISCHARGE:  04/19/2011                              DISCHARGE SUMMARY   ADMITTING DIAGNOSES: 1. Intrauterine pregnancy at 73 and 1/2 weeks' estimated gestational     age. 2. Previous cesarean section, desires repeat.  DISCHARGE DIAGNOSES: 1. Status post low transverse cesarean section. 2. Viable female infant.  PROCEDURE:  Repeat low transverse cesarean section.  REASON FOR ADMISSION:  Please see dictated H and P.  HOSPITAL COURSE:  The patient is a 30 year old white married female gravida 2, para 1 that was admitted to [facility] at 58- 1/2 weeks' estimated gestational age for scheduled cesarean section. The patient had had a previous cesarean delivery and desired repeat. The patient's pregnancy had been complicated by maternal tachycardia and had been treated with atenolol under the care of Dr. Caryl Comes, cardiologist, and was diagnosed with dysautonomia.  On the morning of admission, the patient was taken to the operating room where spinal anesthesia was administered without difficulty.  A low transverse incision was made with delivery of a viable female infant with Apgars of 8 at 1 minute and 9 at 5 minutes.  Arterial cord pH was 7.39.  The patient tolerated the procedure well and was taken to the recovery room in stable condition.  On postoperative day #1, the patient was without complaint.  Vital signs were stable.  Abdomen was soft.  Fundus was firm and nontender.  Abdominal dressing was noted to have a small amount of drainage from the bandage.  Foley had been discontinued.  She had voided 1 time at the time of rounding.  Laboratory findings showed hemoglobin of 10.4 and platelet  count of 133,000, blood type is known to be O positive. On postoperative day #2, the patient complained of a productive cough.  Vital signs were stable.  She was afebrile.  Lungs were clear to auscultation.  Abdomen was soft.  Fundus was firm and nontender.  Incision was noted to have some scant amount of bright red bleeding noted from the midpoint of the incision.  The patient was started on Z-Pak and Tessalon Perles were administered.  On postoperative day #3, the patient was without complaint.  Vital signs were stable.  Fundus was firm and nontender.  Incision was clean, dry, and intact.  Lungs continued to be clear to auscultation.  Staples were left in place.  Discharge instructions were reviewed and the patient was later discharged home.  CONDITION ON DISCHARGE:  Stable.  DIET:  Regular as tolerated.  ACTIVITY:  No heavy lifting, no driving x2 weeks, no vaginal entry.  FOLLOWUP:  The patient is to follow up in the office in 2-3 days for staple removal.  She is to call for temperature greater than 100 degrees, persistent nausea, vomiting, heavy vaginal bleeding, and/or redness or drainage from an incisional site.  DISCHARGE  MEDICATIONS: 1. Tylox #30, 1 p.o. every 4-6 hours p.r.n. 2. Tessalon Perles 30, 1 p.o. t.i.d. p.r.n. cough. 3. Z-Pak completion. 4. Atenolol 25 mg 1 p.o. daily.     Juanda Chance, N.P.   ______________________________ Darlyn Chamber, M.D.    CC/MEDQ  D:  04/19/2011  T:  04/19/2011  Job:  [id number]  Electronically Signed by Juanda Chance N.P. on 04/22/2011 09:10:40 AM Electronically Signed by Arvella Nigh M.D. on 05/14/2011 09:26:49 AM

## 2011-06-07 ENCOUNTER — Encounter: Payer: Self-pay | Admitting: Internal Medicine

## 2011-06-07 ENCOUNTER — Ambulatory Visit (INDEPENDENT_AMBULATORY_CARE_PROVIDER_SITE_OTHER): Payer: 59 | Admitting: Internal Medicine

## 2011-06-07 DIAGNOSIS — R000 Tachycardia, unspecified: Secondary | ICD-10-CM

## 2011-06-07 DIAGNOSIS — G909 Disorder of the autonomic nervous system, unspecified: Secondary | ICD-10-CM

## 2011-06-07 DIAGNOSIS — I498 Other specified cardiac arrhythmias: Secondary | ICD-10-CM

## 2011-06-07 DIAGNOSIS — G901 Familial dysautonomia [Riley-Day]: Secondary | ICD-10-CM

## 2011-06-07 NOTE — Assessment & Plan Note (Signed)
resolved 

## 2011-06-07 NOTE — Assessment & Plan Note (Signed)
>>  ASSESSMENT AND PLAN FOR SINUS TACHYCARDIA WRITTEN ON 06/07/2011 10:34 AM BY Duke Salvia, MD  resolved

## 2011-06-07 NOTE — Assessment & Plan Note (Signed)
Much improved  Will discontinue atenolol and have given her web sites for POTSplace and NDRF  Will see prn

## 2011-06-07 NOTE — Patient Instructions (Signed)
Your physician has recommended you make the following change in your medication:  1) Stop atenolol.  We will see you back on an as needed basis.  Marland Kitchen

## 2011-06-07 NOTE — Progress Notes (Signed)
  HPI  Terry [name] is a 30 y.o. female Seen in followup for dysautonmia that was aggravated by pregnancy now consummated     She has a  long-standing history of orthostatic intolerance, shower intolerance, Jacuzzi intolerance who in the last trimester of her second pregnancy has had significant problems with presyncope. It has been accompanied by a prodrome of nausea flushing tinnitus and visual changes. It is relieved by lying down and exacerbated by standing.  Apart from post exercise her symptoms have all but abated  Her diet is salt and water more replete than before Past Medical History  Diagnosis Date  . Tachycardia   . Celiac disease   . IBS (irritable bowel syndrome)   . Migraine   . Mononucleosis   . TMJ disease     Past Surgical History  Procedure Date  . Cesarean section   . Laparoscopy   . Tooth extraction     Current Outpatient Prescriptions  Medication Sig Dispense Refill  . atenolol (TENORMIN) 25 MG tablet Take 25 mg by mouth daily.        . Prenatal Vit-Fe Sulfate-FA (PRENATAL VITAMIN PO) 1 tab po qd         No Known Allergies  Review of Systems negative except from HPI and PMH  Physical Exam Well developed and well nourished in no acute distress HENT normal E scleral and icterus clear Neck Supple JVP flat with slow rate; carotids brisk and full Clear to ausculation Regular rate and rhythm, no murmurs gallops or rub Soft with active bowel sounds No clubbing cyanosis and edema Alert and oriented, grossly normal motor and sensory function Skin Warm and Dry    Assessment and  Plan

## 2011-08-07 LAB — ABO/RH: ABO/RH(D): O POS

## 2011-08-07 LAB — CBC
MCHC: 35.1
MCV: 90.3
Platelets: 305
WBC: 6.1

## 2014-02-24 ENCOUNTER — Other Ambulatory Visit (HOSPITAL_COMMUNITY)
Admission: RE | Admit: 2014-02-24 | Discharge: 2014-02-24 | Disposition: A | Discharge status: Home / Self Care | Payer: 59 | Source: Ambulatory Visit | Attending: Internal Medicine | Admitting: Internal Medicine

## 2014-02-24 ENCOUNTER — Other Ambulatory Visit: Payer: Self-pay | Admitting: Internal Medicine

## 2014-02-24 DIAGNOSIS — Z01419 Encounter for gynecological examination (general) (routine) without abnormal findings: Secondary | ICD-10-CM | POA: Insufficient documentation

## 2014-03-23 ENCOUNTER — Other Ambulatory Visit: Payer: Self-pay

## 2014-04-14 ENCOUNTER — Other Ambulatory Visit: Payer: Self-pay

## 2016-03-14 LAB — CBC AND DIFFERENTIAL
HEMATOCRIT: 39 (ref 36–46)
Hemoglobin: 13.1 (ref 12.0–16.0)
PLATELETS: 206 (ref 150–399)
WBC: 4.7 — AB (ref 5.0–15.0)

## 2016-09-13 ENCOUNTER — Encounter: Payer: Self-pay | Admitting: Internal Medicine

## 2016-11-20 ENCOUNTER — Encounter: Payer: Self-pay | Admitting: Internal Medicine

## 2016-11-20 ENCOUNTER — Other Ambulatory Visit (INDEPENDENT_AMBULATORY_CARE_PROVIDER_SITE_OTHER): Payer: 59

## 2016-11-20 ENCOUNTER — Ambulatory Visit (INDEPENDENT_AMBULATORY_CARE_PROVIDER_SITE_OTHER): Payer: 59 | Admitting: Internal Medicine

## 2016-11-20 VITALS — BP 98/64 | HR 92 | Ht 68.5 in | Wt 164.0 lb

## 2016-11-20 DIAGNOSIS — K900 Celiac disease: Secondary | ICD-10-CM

## 2016-11-20 LAB — COMPREHENSIVE METABOLIC PANEL
ALK PHOS: 54 U/L (ref 39–117)
ALT: 8 U/L (ref 0–35)
AST: 12 U/L (ref 0–37)
Albumin: 4.4 g/dL (ref 3.5–5.2)
BILIRUBIN TOTAL: 0.3 mg/dL (ref 0.2–1.2)
BUN: 16 mg/dL (ref 6–23)
CALCIUM: 9.4 mg/dL (ref 8.4–10.5)
CO2: 30 mEq/L (ref 19–32)
Chloride: 102 mEq/L (ref 96–112)
Creatinine, Ser: 0.79 mg/dL (ref 0.40–1.20)
GFR: 86.69 mL/min (ref >60.00)
GLUCOSE: 87 mg/dL (ref 70–99)
Potassium: 3.9 mEq/L (ref 3.5–5.1)
Sodium: 139 mEq/L (ref 135–145)
TOTAL PROTEIN: 7.1 g/dL (ref 6.0–8.3)

## 2016-11-20 LAB — CBC WITH DIFFERENTIAL/PLATELET
BASOS ABS: 0 10*3/uL (ref 0.0–0.1)
Basophils Relative: 0.4 % (ref 0.0–3.0)
Eosinophils Absolute: 0.1 10*3/uL (ref 0.0–0.7)
Eosinophils Relative: 1.3 % (ref 0.0–5.0)
HCT: 36.9 % (ref 36.0–46.0)
Hemoglobin: 12.5 g/dL (ref 12.0–15.0)
LYMPHS PCT: 21.8 % (ref 12.0–46.0)
Lymphs Abs: 1.4 10*3/uL (ref 0.7–4.0)
MCHC: 33.8 g/dL (ref 30.0–36.0)
MCV: 83.2 fl (ref 78.0–100.0)
MONOS PCT: 7.7 % (ref 3.0–12.0)
Monocytes Absolute: 0.5 10*3/uL (ref 0.1–1.0)
NEUTROS PCT: 68.8 % (ref 43.0–77.0)
Neutro Abs: 4.5 10*3/uL (ref 1.4–7.7)
Platelets: 270 10*3/uL (ref 150.0–400.0)
RBC: 4.43 Mil/uL (ref 3.87–5.11)
RDW: 14.5 % (ref 11.5–15.5)
WBC: 6.5 10*3/uL (ref 4.0–10.5)

## 2016-11-20 NOTE — Patient Instructions (Addendum)
If you are age 38 or older, your body mass index should be between 23-30. Your Body mass index is 24.57 kg/m. If this is out of the aforementioned range listed, please consider follow up with your Primary Care Provider.  If you are age 51 or younger, your body mass index should be between 19-25. Your Body mass index is 24.57 kg/m. If this is out of the aformentioned range listed, please consider follow up with your Primary Care Provider.   Your physician has requested that you go to the basement for lab work before leaving today.    You have been scheduled for an endoscopy. Please follow written instructions given to you at your visit today. If you use inhalers (even only as needed), please bring them with you on the day of your procedure. Your physician has requested that you go to www.startemmi.com and enter the access code given to you at your visit today. This web site gives a general overview about your procedure. However, you should still follow specific instructions given to you by our office regarding your p  Thank you for choosing [facility]  Dr Scarlette Shorts

## 2016-11-20 NOTE — Progress Notes (Signed)
HISTORY OF PRESENT ILLNESS:  Terry [name] is a 36 y.o. female , Corporate treasurer at [employer], who is self-referred with a chief complaint of celiac sprue for which she wishes to be monitored. Limited outside records reviewed. Patient has had various long-standing GI complaints since childhood. For lower abdominal pain she underwent gynecologic evaluation in 2001 including laparoscopy converted laparotomy with ill-defined possible bowel injury versus food filled diverticulum. Subsequent evaluations with Dr. Collene Mares in 2002 including flexible sigmoidoscopy which revealed diverticulosis. Subsequent office evaluation in 2011 for significant problems with bloating, gas, and bowel habit issues. She was found to have significantly elevated tissue transglutaminase antibody IgA at 160 (less than 20). She did not have endoscopy but initiated strict gluten-free diet. Patient states that her problems with dyspepsia, abdominal pain, nausea with intermittent vomiting, headaches, and bloating all resolved. She tells me that she has been extremely careful with her diet but mild dietary indiscretion, such as eating a normal muffin, mainly due to significant GI distress. She has not had a recent blood work. She is concerned about possibility of developing cancer. 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  REVIEW OF SYSTEMS:  All non-GI ROS negative except for anxiety, epistaxis, urinary leakage  Past Medical History:  Diagnosis Date  . Celiac disease   . IBS (irritable bowel syndrome)   . Migraine   . Mononucleosis   . Tachycardia   . TMJ disease     Past Surgical History:  Procedure Laterality Date  . ABDOMINOPLASTY    . CESAREAN SECTION    . LAPAROSCOPY    . TOOTH EXTRACTION      Social History Terry [name]  reports that she has never smoked. She has never used smokeless tobacco. She reports that she does not drink alcohol or use drugs.  family history includes Aneurysm in her maternal grandmother; Heart attack  in her paternal grandmother; Prostate cancer in her maternal grandfather.  No Known Allergies     PHYSICAL EXAMINATION: Vital signs: BP 98/64   Pulse 92   Ht 5' 8.5" (1.74 m)   Wt 164 lb (74.4 kg)   BMI 24.57 kg/m   Constitutional: generally well-appearing, no acute distress Psychiatric: alert and oriented x3, cooperative Eyes: extraocular movements intact, anicteric, conjunctiva pink Mouth: oral pharynx moist, no lesions Neck: supple no lymphadenopathy Cardiovascular: heart regular rate and rhythm, no murmur Lungs: clear to auscultation bilaterally Abdomen: soft, nontender, nondistended, no obvious ascites, no peritoneal signs, normal bowel sounds, no organomegaly Rectal:Omitted Extremities: no clubbing cyanosis or lower extremity edema bilaterally Skin: no lesions on visible extremities Neuro: No focal deficits. No asterixis.   ASSESSMENT:  #1. Celiac sprue by remote serology. Has been compliant with gluten-free lifestyle. Asymptomatic #2. Remote history of laparoscopy/laparotomy as described.  PLAN:  #1. Extensive discussion of celiac disease including etiology, pathophysiology, treatment, complications, and outcomes #2. Obtain tissue transglutaminase antibody IgA, CBC, and comprehensive metabolic panel today #3. Schedule upper endoscopy with duodenal biopsies.The nature of the procedure, as well as the risks, benefits, and alternatives were carefully and thoroughly reviewed with the patient. Ample time for discussion and questions allowed. The patient understood, was satisfied, and agreed to proceed. #4. Annual monitoring or more frequently if needed

## 2016-11-21 LAB — TISSUE TRANSGLUTAMINASE, IGA: TISSUE TRANSGLUTAMINASE AB, IGA: 1 U/mL (ref <4)

## 2016-11-22 ENCOUNTER — Ambulatory Visit (AMBULATORY_SURGERY_CENTER): Payer: 59 | Admitting: Internal Medicine

## 2016-11-22 ENCOUNTER — Encounter: Payer: Self-pay | Admitting: Internal Medicine

## 2016-11-22 VITALS — BP 107/56 | HR 81 | Temp 97.1°F | Resp 11 | Ht 68.5 in | Wt 164.0 lb

## 2016-11-22 DIAGNOSIS — K900 Celiac disease: Secondary | ICD-10-CM | POA: Diagnosis not present

## 2016-11-22 DIAGNOSIS — K3189 Other diseases of stomach and duodenum: Secondary | ICD-10-CM | POA: Diagnosis not present

## 2016-11-22 MED ORDER — SODIUM CHLORIDE 0.9 % IV SOLN: 500.0000 mL | INTRAVENOUS | Status: DC

## 2016-11-22 NOTE — Op Note (Addendum)
Terry [name] Patient Name: Terry [name] Procedure Date: 11/22/2016 11:51 AM MRN: [id number] Endoscopist: Docia Chuck. Henrene Pastor , MD Age: 36 Referring MD:  Date of Birth: 10/11/1981 Gender: Female Account #: [id number] Procedure:                Upper GI endoscopy, with biopsies Indications:              Surveillance procedure, Follow-up of celiac                            disease. Diagnosed 2011/2012. Remarkably elevated                            tissue transglutaminase antibody IgA. Did NOT have                            endoscopy with biopsies at that time. He was anemic                            and hypoalbuminemic at the time. Has been strictly                            gluten-free since with resolution of all GI                            symptoms. Blood work yesterday normal including                            repeat tissue transglutaminase antibody IgA For                            endoscopy Medicines:                Monitored Anesthesia Care Procedure:                Pre-Anesthesia Assessment:                           - Prior to the procedure, a History and Physical                            was performed, and patient medications and                            allergies were reviewed. The patient's tolerance of                            previous anesthesia was also reviewed. The risks                            and benefits of the procedure and the sedation                            options and risks were discussed with the patient.  All questions were answered, and informed consent                            was obtained. Prior Anticoagulants: The patient has                            taken no previous anticoagulant or antiplatelet                            agents. ASA Grade Assessment: I - A normal, healthy                            patient. After reviewing the risks and benefits,                            the patient was deemed in  satisfactory condition to                            undergo the procedure.                           After obtaining informed consent, the endoscope was                            passed under direct vision. Throughout the                            procedure, the patient's blood pressure, pulse, and                            oxygen saturations were monitored continuously. The                            Model GIF-[id number]) scope was introduced                            through the mouth, and advanced to the second part                            of duodenum. The upper GI endoscopy was                            accomplished without difficulty. The patient                            tolerated the procedure well. Scope In: Scope Out: Findings:                 The esophagus was normal.                           The stomach was normal.                           The examined duodenum was normal. Biopsies for  histology were taken with a cold forceps for                            evaluation of celiac disease. 2 biopsies from each                            of the first 3 portions.                           The cardia and gastric fundus were normal on                            retroflexion. Complications:            No immediate complications. Estimated Blood Loss:     Estimated blood loss: none. Impression:               - Normal esophagus.                           - Normal stomach.                           - Normal examined duodenum. Biopsied. Recommendation:           1. Continue strict gluten-free diet                           2. Await biopsy results. Dr. Henrene Pastor will send you a                            letter results and further recommendations (if any)                           3. Routine office follow-up with Dr. Henrene Pastor in 1                            year. Sooner if needed. Docia Chuck. Henrene Pastor, MD 11/22/2016 12:07:26 PM This report has been signed  electronically.

## 2016-11-22 NOTE — Progress Notes (Signed)
Called to room to assist during endoscopic procedure.  Patient ID and intended procedure confirmed with present staff. Received instructions for my participation in the procedure from the performing physician.  

## 2016-11-22 NOTE — Progress Notes (Signed)
A/ox3 pleased with MAC, report to [facility]

## 2016-11-22 NOTE — Patient Instructions (Signed)
YOU HAD AN ENDOSCOPIC PROCEDURE TODAY AT [facility]:   Refer to the procedure report that was given to you for any specific questions about what was found during the examination.  If the procedure report does not answer your questions, please call your gastroenterologist to clarify.  If you requested that your care partner not be given the details of your procedure findings, then the procedure report has been included in a sealed envelope for you to review at your convenience later.  YOU SHOULD EXPECT: Some feelings of bloating in the abdomen. Passage of more gas than usual.  Walking can help get rid of the air that was put into your GI tract during the procedure and reduce the bloating. If you had a lower endoscopy (such as a colonoscopy or flexible sigmoidoscopy) you may notice spotting of blood in your stool or on the toilet paper. If you underwent a bowel prep for your procedure, you may not have a normal bowel movement for a few days.  Please Note:  You might notice some irritation and congestion in your nose or some drainage.  This is from the oxygen used during your procedure.  There is no need for concern and it should clear up in a day or so.  SYMPTOMS TO REPORT IMMEDIATELY:    Following upper endoscopy (EGD)  Vomiting of blood or coffee ground material  New chest pain or pain under the shoulder blades  Painful or persistently difficult swallowing  New shortness of breath  Fever of 100F or higher  Black, tarry-looking stools  For urgent or emergent issues, a gastroenterologist can be reached at any hour by calling [phone].   DIET:  We do recommend a small meal at first, but then you may proceed to your regular diet.  Drink plenty of fluids but you should avoid alcoholic beverages for 24 hours.  ACTIVITY:  You should plan to take it easy for the rest of today and you should NOT DRIVE or use heavy machinery until tomorrow (because of the sedation medicines used  during the test).    FOLLOW UP: Our staff will call the number listed on your records the next business day following your procedure to check on you and address any questions or concerns that you may have regarding the information given to you following your procedure. If we do not reach you, we will leave a message.  However, if you are feeling well and you are not experiencing any problems, there is no need to return our call.  We will assume that you have returned to your regular daily activities without incident.  If any biopsies were taken you will be contacted by phone or by letter within the next 1-3 weeks.  Please call us at [phone] if you have not heard about the biopsies in 3 weeks.    SIGNATURES/CONFIDENTIALITY: You and/or your care partner have signed paperwork which will be entered into your electronic medical record.  These signatures attest to the fact that that the information above on your After Visit Summary has been reviewed and is understood.  Full responsibility of the confidentiality of this discharge information lies with you and/or your care-partner.    Resume medications. Follow up in 1 year with Dr. Henrene Pastor.

## 2016-11-22 NOTE — Progress Notes (Signed)
A/ox3 pleased with MAC, report to [place]

## 2016-11-25 ENCOUNTER — Telehealth: Payer: Self-pay | Admitting: *Deleted

## 2016-11-25 NOTE — Telephone Encounter (Signed)
  Follow up Call-  Call back number 11/22/2016  Post procedure Call Back phone  # [phone]  Permission to leave phone message Yes  Some recent data might be hidden     Patient questions:  Do you have a fever, pain , or abdominal swelling? No. Pain Score  0 *  Have you tolerated food without any problems? Yes.    Have you been able to return to your normal activities? Yes.    Do you have any questions about your discharge instructions: Diet   Yes.   Medications  No. Follow up visit  No.  Do you have questions or concerns about your Care? No.  Actions: * If pain score is 4 or above: No action needed, pain <4.  Pt.wanted to know what to do if she accidentally ate gluten,explained to pt. That he stated in report to maintain strict gluten free diet and to follow up with him in one year and sooner if she need to pt. Verbalize understanding.

## 2016-11-28 ENCOUNTER — Encounter: Payer: Self-pay | Admitting: Internal Medicine

## 2017-01-09 DIAGNOSIS — J00 Acute nasopharyngitis [common cold]: Secondary | ICD-10-CM | POA: Diagnosis not present

## 2017-03-30 ENCOUNTER — Observation Stay (HOSPITAL_COMMUNITY)
Admission: EM | Admit: 2017-03-30 | Discharge: 2017-03-31 | Disposition: A | Discharge status: Home / Self Care | Payer: 59 | Attending: Internal Medicine | Admitting: Internal Medicine

## 2017-03-30 ENCOUNTER — Encounter (HOSPITAL_COMMUNITY): Payer: Self-pay | Admitting: Emergency Medicine

## 2017-03-30 DIAGNOSIS — I499 Cardiac arrhythmia, unspecified: Secondary | ICD-10-CM

## 2017-03-30 DIAGNOSIS — R002 Palpitations: Secondary | ICD-10-CM

## 2017-03-30 DIAGNOSIS — F419 Anxiety disorder, unspecified: Secondary | ICD-10-CM | POA: Insufficient documentation

## 2017-03-30 DIAGNOSIS — R42 Dizziness and giddiness: Secondary | ICD-10-CM | POA: Diagnosis not present

## 2017-03-30 DIAGNOSIS — Z91018 Allergy to other foods: Secondary | ICD-10-CM | POA: Diagnosis not present

## 2017-03-30 DIAGNOSIS — E876 Hypokalemia: Secondary | ICD-10-CM

## 2017-03-30 DIAGNOSIS — R000 Tachycardia, unspecified: Secondary | ICD-10-CM | POA: Diagnosis present

## 2017-03-30 DIAGNOSIS — G43909 Migraine, unspecified, not intractable, without status migrainosus: Secondary | ICD-10-CM | POA: Insufficient documentation

## 2017-03-30 DIAGNOSIS — I480 Paroxysmal atrial fibrillation: Secondary | ICD-10-CM | POA: Diagnosis not present

## 2017-03-30 DIAGNOSIS — R404 Transient alteration of awareness: Secondary | ICD-10-CM | POA: Diagnosis not present

## 2017-03-30 DIAGNOSIS — I471 Supraventricular tachycardia, unspecified: Secondary | ICD-10-CM | POA: Diagnosis present

## 2017-03-30 DIAGNOSIS — Z79899 Other long term (current) drug therapy: Secondary | ICD-10-CM | POA: Insufficient documentation

## 2017-03-30 LAB — TSH: TSH: 1.06 u[IU]/mL (ref 0.350–4.500)

## 2017-03-30 LAB — BASIC METABOLIC PANEL
Anion gap: 10 (ref 5–15)
BUN: 12 mg/dL (ref 6–20)
CHLORIDE: 105 mmol/L (ref 101–111)
CO2: 22 mmol/L (ref 22–32)
CREATININE: 0.72 mg/dL (ref 0.44–1.00)
Calcium: 9.4 mg/dL (ref 8.9–10.3)
GFR calc non Af Amer: >60 mL/min (ref >60)
Glucose, Bld: 97 mg/dL (ref 65–99)
Potassium: 3.3 mmol/L — ABNORMAL LOW (ref 3.5–5.1)
SODIUM: 137 mmol/L (ref 135–145)

## 2017-03-30 LAB — CBC
HEMATOCRIT: 37.6 % (ref 36.0–46.0)
HEMOGLOBIN: 12.6 g/dL (ref 12.0–15.0)
MCH: 30.2 pg (ref 26.0–34.0)
MCHC: 33.5 g/dL (ref 30.0–36.0)
MCV: 90.2 fL (ref 78.0–100.0)
Platelets: 177 10*3/uL (ref 150–400)
RBC: 4.17 MIL/uL (ref 3.87–5.11)
RDW: 12.6 % (ref 11.5–15.5)
WBC: 6.4 10*3/uL (ref 4.0–10.5)

## 2017-03-30 LAB — I-STAT TROPONIN, ED: TROPONIN I, POC: 0.01 ng/mL (ref 0.00–0.08)

## 2017-03-30 LAB — MAGNESIUM: MAGNESIUM: 1.9 mg/dL (ref 1.7–2.4)

## 2017-03-30 LAB — HCG, QUANTITATIVE, PREGNANCY: hCG, Beta Chain, Quant, S: <1 m[IU]/mL (ref <5)

## 2017-03-30 LAB — I-STAT BETA HCG BLOOD, ED (MC, WL, AP ONLY): I-stat hCG, quantitative: <5 m[IU]/mL (ref <5)

## 2017-03-30 MED ORDER — ATENOLOL 25 MG PO TABS
12.5000 mg | ORAL_TABLET | Freq: Two times a day (BID) | ORAL | Status: DC
  Administered 2017-03-30: 12.5 mg via ORAL
  Filled 2017-03-30 (×3): qty 1

## 2017-03-30 MED ORDER — SODIUM CHLORIDE 0.9 % IV BOLUS (SEPSIS)
1000.0000 mL | Freq: Once | INTRAVENOUS | Status: AC
  Administered 2017-03-30: 1000 mL via INTRAVENOUS

## 2017-03-30 MED ORDER — LORAZEPAM 2 MG/ML IJ SOLN
1.0000 mg | Freq: Once | INTRAMUSCULAR | Status: AC
  Administered 2017-03-30: 1 mg via INTRAVENOUS
  Filled 2017-03-30: qty 1

## 2017-03-30 MED ORDER — POTASSIUM CHLORIDE CRYS ER 20 MEQ PO TBCR
40.0000 meq | EXTENDED_RELEASE_TABLET | Freq: Once | ORAL | Status: AC
  Administered 2017-03-30: 40 meq via ORAL
  Filled 2017-03-30: qty 2

## 2017-03-30 MED ORDER — SERTRALINE HCL 100 MG PO TABS
100.0000 mg | ORAL_TABLET | Freq: Every day | ORAL | Status: DC
  Filled 2017-03-30: qty 1

## 2017-03-30 MED ORDER — ADULT MULTIVITAMIN W/MINERALS CH
1.0000 | ORAL_TABLET | Freq: Every day | ORAL | Status: DC
  Administered 2017-03-31: 1 via ORAL
  Filled 2017-03-30: qty 1

## 2017-03-30 MED ORDER — CLONAZEPAM 1 MG PO TABS
1.0000 mg | ORAL_TABLET | Freq: Two times a day (BID) | ORAL | Status: DC
  Administered 2017-03-30: 1 mg via ORAL
  Filled 2017-03-30 (×2): qty 1

## 2017-03-30 MED ORDER — LORATADINE 10 MG PO TABS: 10.0000 mg | ORAL_TABLET | Freq: Every day | ORAL | Status: DC | PRN

## 2017-03-30 NOTE — ED Provider Notes (Signed)
[facility] DEPT Provider Note   CSN: [id number] Arrival date & time: 03/30/17  1133     History   Chief Complaint Chief Complaint  Patient presents with  . Tachycardia    HPI Terry [name] is a 36 y.o. female.  Patient with tachycardia with EMS, fast and narrow that resolved with IV stick. Patient with EMS with possible sinus tachycardia. No symptoms but anxious now. No hx of irregular heart rhythm   The history is provided by the patient.  Illness  This is a new problem. The current episode started less than 1 hour ago. The problem has been rapidly improving. Pertinent negatives include no chest pain, no abdominal pain, no headaches and no shortness of breath. Nothing aggravates the symptoms. Relieved by: Tachcardia improved following IV stick by EMS.    Past Medical History:  Diagnosis Date  . Celiac disease   . IBS (irritable bowel syndrome)   . Migraine   . Mononucleosis   . Tachycardia   . TMJ disease     Patient Active Problem List   Diagnosis Date Noted  . Dysautonomia-orthostatic intolerance 04/08/2011  . Sinus tachycardia 04/08/2011  . Pregnancy, third trimester 04/08/2011    Past Surgical History:  Procedure Laterality Date  . ABDOMINOPLASTY    . CESAREAN SECTION    . LAPAROSCOPY    . TOOTH EXTRACTION      OB History    No data available       Home Medications    Prior to Admission medications   Medication Sig Start Date End Date Taking? Authorizing Provider  clonazePAM (KLONOPIN) 1 MG tablet Take 1 mg by mouth daily as needed for anxiety.  01/18/16  Yes [provider]  loratadine (CLARITIN) 10 MG tablet Take 10 mg by mouth daily as needed for allergies.   Yes [provider]  Multiple Vitamin (MULTIVITAMIN) tablet Take 1 tablet by mouth daily.   Yes [provider]  sertraline (ZOLOFT) 100 MG tablet Take 100 mg by mouth daily at 2 PM.   Yes [provider]    Family History Family History  Problem  Relation Age of Onset  . Hypertension Unknown   . Aneurysm Maternal Grandmother   . Prostate cancer Maternal Grandfather   . Heart attack Paternal Grandmother   . Colon cancer Neg Hx   . Stomach cancer Neg Hx   . Rectal cancer Neg Hx   . Esophageal cancer Neg Hx   . Liver cancer Neg Hx     Social History Social History  Substance Use Topics  . Smoking status: Never Smoker  . Smokeless tobacco: Never Used  . Alcohol use No     Allergies   Wheat bran   Review of Systems Review of Systems  Constitutional: Negative for chills and fever.  HENT: Negative for ear pain and sore throat.   Eyes: Negative for pain and visual disturbance.  Respiratory: Negative for cough and shortness of breath.   Cardiovascular: Negative for chest pain and palpitations.  Gastrointestinal: Negative for abdominal pain and vomiting.  Genitourinary: Negative for dysuria and hematuria.  Musculoskeletal: Negative for arthralgias and back pain.  Skin: Negative for color change and rash.  Neurological: Negative for seizures, syncope and headaches.  All other systems reviewed and are negative.    Physical Exam Updated Vital Signs  ED Triage Vitals [03/30/17 1141]  Enc Vitals Group     BP 128/76     Pulse Rate (!) 104  Resp 15     Temp 98.8 F (37.1 C)     Temp Source Temporal     SpO2 99 %     Weight      Height      Head Circumference      Peak Flow      Pain Score      Pain Loc      Pain Edu?      Excl. in [place]?     Physical Exam  Constitutional: She is oriented to person, place, and time. She appears well-developed and well-nourished. No distress.  HENT:  Head: Normocephalic and atraumatic.  Eyes: Conjunctivae are normal. Pupils are equal, round, and reactive to light.  Neck: Normal range of motion. Neck supple.  Cardiovascular: Regular rhythm, normal heart sounds and intact distal pulses.  Tachycardia present.   No murmur heard. Pulmonary/Chest: Effort normal and breath sounds  normal. No respiratory distress.  Abdominal: Soft. There is no tenderness.  Musculoskeletal: She exhibits no edema.  Neurological: She is alert and oriented to person, place, and time.  Skin: Skin is warm and dry. Capillary refill takes less than 2 seconds.  Psychiatric: She has a normal mood and affect.  Nursing note and vitals reviewed.    ED Treatments / Results  Labs (all labs ordered are listed, but only abnormal results are displayed) Labs Reviewed  BASIC METABOLIC PANEL - Abnormal; Notable for the following:       Result Value   Potassium 3.3 (*)    All other components within normal limits  MAGNESIUM  CBC  I-STAT BETA HCG BLOOD, ED ([facility], [facility], [facility] ONLY)  I-STAT TROPOININ, ED    EKG  EKG Interpretation None       Radiology No results found.  Procedures Procedures (including critical care time)  Medications Ordered in ED Medications  sodium chloride 0.9 % bolus 1,000 mL (0 mLs Intravenous Stopped 03/30/17 1218)  LORazepam (ATIVAN) injection 1 mg (1 mg Intravenous Given 03/30/17 1217)     Initial Impression / Assessment and Plan / ED Course  I have reviewed the triage vital signs and the nursing notes.  Pertinent labs & imaging results that were available during my care of the patient were reviewed by me and considered in my medical decision making (see chart for details).     Terry [name] is a 36 year old female with history of celiac disease, migraine, anxiety who presents to the ED with tachycardia. Patient's vitals at time of arrival to the ED are significant for tachycardia at 108 but otherwise unremarkable and patient is without fever. Patient called EMS for heart palpitations and was found to have heart rate greater than 200. EKG showed fast and fairly narrow rhythm. EMS concern for ventricular tachycardia. Patient denied chest pain, shortness of breath and had normal vitals otherwise. Patient denies any history of arrhythmias. Patient per EMS had conversion  of heart rate after IV stick. On arrival, patient with EKG that shows sinus tachycardia with heart rate of 97 with otherwise no signs of ischemia and normal intervals. Patient otherwise unremarkable exam other than some anxiety. Patient was given 1 mg of Ativan. CBC, BMP, pregnancy test, magnesium, troponin ordered. Patient given normal saline bolus.  Labs were unremarkable except for slight hypokalemia at 3.3. Patient with troponin within normal limits. Given concern for possible SVT versus V. tach versus other arrhythmia cardiologist consulted. They reviewed EKGs and believe patient likely with SVT or possible afib on one EKG but given  the possibility of worse arrhythmia including [facility] will admit for observation. Patient admitted to cardiology in stable condition.  Final Clinical Impressions(s) / ED Diagnoses   Final diagnoses:  Palpitations  Tachycardia  Hypokalemia  Cardiac arrhythmia, unspecified cardiac arrhythmia type    New Prescriptions New Prescriptions   No medications on file     Lennice Sites, DO 03/30/17 Compton, Peck, DO 03/30/17 1533    Veryl Speak, MD 03/31/17 1113

## 2017-03-30 NOTE — ED Triage Notes (Signed)
Pt here from home with c/o fast heart rate ?svt converted with IV stick with Ems ARRIVED IN sr / st

## 2017-03-30 NOTE — H&P (Addendum)
CARDIOLOGY HISTORY AND PHYSICAL   Patient ID: Terry [name] MRN: [id number]  DOB/AGE: Apr 17, 1981 36 y.o. Admit date: 03/30/2017  Primary Care Physician: Terry Seashore, MD Primary Cardiologist: Terry Comes MD  Clinical Summary Terry [name] is a 36 y.o.[name] with A long history of orthostatic intolerance, shower intolerance, who has not been seen by cardiology since 2012 where she was followed by Dr. Caryl [name] in the setting of dysautonomia which was aggravated by pregnancy. At that time atenolol was discontinued and she was referred to websites which gave educational information on POTS. Other history includes celiac disease, migraines, anxiety.  The patient was in her usual state of health, had done some shopping in the morning and was beginning to clean her house, one walking across the living room she had sudden onset of dizziness and felt her heart rate pounding. She became weak, she can literally feel her heart rate through for chest. Call EMS.  She presented to the emergency room via EMS with narrow complex tachycardia. On arrival to the emergency room the patient's blood pressure was 120/76, heart rate 104, O2 sat 99%, she was afebrile. EKG revealed sinus tachycardia, heart rate 94 bpm, nonspecific T-wave abnormalities. The patient apparently was very tachycardic, but when EMS arrived and started IV that resolved heart rate to more normal rate. The patient sat up and drank some water while EMS was there, and her heart recently increased again.  Review of EKG strips from [facility] revealed paroxysmal atrial fibrillationOn the 12-lead EKG initially followed by a normal sinus rhythm EKG.  A rhythm strip showed what looks like rapid SVT possible reentry but with some abberancy toward the end of the tracing. She did note that she had had some diarrhea this past week related to her celiac disease and that may be why her potassium is low.  Pertinent labs: Potassium 3.3, creatinine 0.72,  hemoglobin 12.6, hematocrit 37.6, white blood cells 6.4, platelets 177. Magnesium 1.9.  In the emergency room, the patient was given IV fluids and lorazepam 1 mg IV 1. D-dimer, chest x-ray, was not completed.   On review of patient's dietary habits, she drinks a lot of caffeine during the day. 2-3 cups of coffee in the morning minimum of 4 Mountain Dew's plus an additional 4 glasses of iced tea each day. She does not drink, use illicit drugs, or smoke.  Allergies  Allergen Reactions  . Wheat Bran Other (See Comments)    All gluten    Home Medications  (Not in a hospital admission)  Scheduled Medications   Infusions   PRN Medications    Past Medical History:  Diagnosis Date  . Celiac disease   . IBS (irritable bowel syndrome)   . Migraine   . Mononucleosis   . Tachycardia   . TMJ disease     Past Surgical History:  Procedure Laterality Date  . ABDOMINOPLASTY    . CESAREAN SECTION    . LAPAROSCOPY    . TOOTH EXTRACTION      Family History  Problem Relation Age of Onset  . Hypertension Unknown   . Aneurysm Maternal Grandmother   . Prostate cancer Maternal Grandfather   . Heart attack Paternal Grandmother   . Colon cancer Neg Hx   . Stomach cancer Neg Hx   . Rectal cancer Neg Hx   . Esophageal cancer Neg Hx   . Liver cancer Neg Hx     Social History Terry [name] reports that she has never smoked. She has  never used smokeless tobacco. Terry [name] reports that she does not drink alcohol.   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  Review of Systems Otherwise reviewed and negative except as outlined.  Physical Examination Temp:  [98.8 F (37.1 C)] 98.8 F (37.1 C) (05/13 1141) Pulse Rate:  [98-111] 111 (05/13 1400) Resp:  [12-17] 16 (05/13 1400) BP: (110-129)/(71-84) 112/73 (05/13 1400) SpO2:  [94 %-100 %] 99 % (05/13 1400)  Intake/Output Summary (Last 24 hours) at 03/30/17 1427 Last data  filed at 03/30/17 1218  Gross per 24 hour  Intake             1000 ml  Output                0 ml  Net             1000 ml   Terry [name] in no acute distress  Gen: No acute distress. HEENT: Conjunctiva and lids normal, oropharynx clear with moist mucosa. Neck: Supple, no elevated JVP or carotid bruits, no thyromegaly. Lungs: Clear to auscultation, nonlabored breathing at rest. Cardiac:  Regular rate and rhythm, tachycardic  no S3 or significant systolic murmur, no pericardial rub. Abdomen: Soft, nontender, no hepatomegaly, bowel sounds present, no guarding or rebound. Extremities: No pitting edema, distal pulses 2+. Skin: Warm and dry. Musculoskeletal: No kyphosis. Neuropsychiatric: Alert and oriented x3, affect grossly appropriate.   Lab Results  Basic Metabolic Panel:  Recent Labs Lab 03/30/17 1154  NA 137  K 3.3*  CL 105  CO2 22  GLUCOSE 97  BUN 12  CREATININE 0.72  CALCIUM 9.4  MG 1.9    CBC:  Recent Labs Lab 03/30/17 1154  WBC 6.4  HGB 12.6  HCT 37.6  MCV 90.2  PLT 177    Prior Cardiac Testing/Procedures:   ECG: Review of EKG on arrival to ER, revealed sinus tachycardia, no evidence of WPW, or Brugada.   Impression and Recommendations  1. Abnormal EKG with evidence of SVT, probable reentry tachycardia, ventricular tachycardia, with paroxysmal atrial flutter. (Copies of her strips are available on the chart.) The patient will be admitted for observation, repeat echocardiogram, TSH,. May need electrophysiology consultation and she has been followed by Dr. Caryl [name] in the past. Burnis Medin do a pregnancy test. In the interim, will start low-dose atenolol 12.5 mg twice a day, and observe her response to treatment.  2. Hypokalemia: Found to have a potassium of 3.3 on admission, will replete,  3. History of POTS: The patient had been asymptomatic until this time. She does drink a lot of caffeine, and is not staying hydrated as directed in the  past. This is reinforced.   Signed: Phill Myron. [place] [name], ANP, AACC  03/30/2017, 2:27 PM Co-Sign MD    Patient seen and examined, history as noted above with minor revisions by me.  Her initial rhythm strip showed what looked like SVT with some abberrancy toward the end of it.  A twelve lead EKG done by EMS prior to starting the IV showed atrial fibrillation with rapid ventricular response.  Subsequent EKG was normal.  EKG here is now normal.  Her husband has had a vasectomy and her last period was 2 weeks ago.  She does note some episodic palpitations but has never worn a cardiac monitor.  1.  Paroxysmal atrial fibrillation currently resolved 2.  History of celiac disease 3.  Prior history of dysautonomia  probably clinically resolved  Recommendations:  She is anxious and would like to stay overnight for observation.  I think this is reasonable.  We'll check a pregnancy test on her and if this is negative start her on atenolol.  She needs to have a repeat echocardiogram and a repeat set of thyroids.  Replete potassium.  EP to see in the morning.  I would anticipate early discharge tomorrow.  Kerry Hough MD [facility] 3:22 PM

## 2017-03-31 ENCOUNTER — Other Ambulatory Visit (HOSPITAL_COMMUNITY): Payer: 59

## 2017-03-31 ENCOUNTER — Other Ambulatory Visit: Payer: Self-pay | Admitting: Physician Assistant

## 2017-03-31 DIAGNOSIS — I471 Supraventricular tachycardia: Secondary | ICD-10-CM

## 2017-03-31 LAB — HIV ANTIBODY (ROUTINE TESTING W REFLEX): HIV Screen 4th Generation wRfx: NONREACTIVE

## 2017-03-31 LAB — T3 UPTAKE: T3 Uptake Ratio: 25 % (ref 24–39)

## 2017-03-31 MED ORDER — SERTRALINE HCL 100 MG PO TABS
100.0000 mg | ORAL_TABLET | Freq: Every day | ORAL | Status: DC
  Administered 2017-03-31: 100 mg via ORAL

## 2017-03-31 MED ORDER — METOCLOPRAMIDE HCL 5 MG/ML IJ SOLN
10.0000 mg | Freq: Once | INTRAMUSCULAR | Status: AC
  Administered 2017-03-31: 10 mg via INTRAVENOUS
  Filled 2017-03-31: qty 2

## 2017-03-31 MED ORDER — PROPRANOLOL HCL 40 MG PO TABS: 40.0000 mg | ORAL_TABLET | Freq: Every day | ORAL | 2 refills | Status: DC | PRN

## 2017-03-31 MED ORDER — ACETAMINOPHEN 325 MG PO TABS
650.0000 mg | ORAL_TABLET | Freq: Four times a day (QID) | ORAL | Status: DC | PRN
  Administered 2017-03-31: 650 mg via ORAL
  Filled 2017-03-31: qty 2

## 2017-03-31 MED ORDER — SUMATRIPTAN SUCCINATE 6 MG/0.5ML ~~LOC~~ SOLN
6.0000 mg | Freq: Once | SUBCUTANEOUS | Status: AC
  Administered 2017-03-31: 6 mg via SUBCUTANEOUS
  Filled 2017-03-31: qty 0.5

## 2017-03-31 MED ORDER — SODIUM CHLORIDE 0.9 % IV SOLN
INTRAVENOUS | Status: DC
  Administered 2017-03-31: 15:00:00 via INTRAVENOUS

## 2017-03-31 NOTE — Discharge Summary (Signed)
ELECTROPHYSIOLOGY PROCEDURE DISCHARGE SUMMARY    Patient ID: Terry [name],  MRN: [id number], DOB/AGE: May 04, 1981 36 y.o.  Admit date: 03/30/2017 Discharge date: 03/31/2017  Primary Care Physician: Merrilee Seashore, MD  Primary Electrophysiologist: Dr. Caryl Comes  Primary Discharge Diagnosis:  1. PSVT 2. Migraine HA  Secondary Discharge Diagnosis:  1. Anxiety  Allergies  Allergen Reactions  . Wheat Bran Other (See Comments)    All gluten     Procedures This Admission:  None  Brief HPI: Terry [name] is a 36 y.o. female was brought to [facility] ER vias EMS with tachycardia, c/o sudden onset palpitations and weakness.  No CP or SOB.    Hospital Course:  The patient was admitted to telemetry. PMHx includes: celiac disease, migraines, hx of dysautonomia (clinically resolved), and anxiety that she uses Klonopin for PRN.  In review of EMS EKGs and record, she was found upon arrival to be in what Dr. Caryl Comes felt was an SVT that degraded to AFib and spontaneously to SR.  The patient makes mention of a second episode of the same tachycardia these EKGs we do not have.  An echo doppler was ordered but not yet completed.  Labs noted mild hypokalemia that was replaced (with reports of mild diarrhea in the proceeding days), otherwise were unremarkable, HCG was negative.  VSS.  She was started on low dose atenolol.  Since being here she has not had any further tachycardia.  Historically she has had fleeting palpitations, usually at night when on her L side, this is the first of this type of event.  While here the ptient did suffer from a significant migraine associated with severe HA as well as N/V, she was treated with SQ Sumatriptan and IV reglan with complete resolution.   After lengthy discussion, including pill in the pocket Inderal use, as well as ablation procedure.  It was decided to discharge her to f/u with the echo out patient as well as follow up visit in 2 weeks.  She will be Rx Inderal  18m PO daily PRN for palpitations.  Should she have recurrent tachycardia or want to revisit ablation we will plan for this.  The patient was examined y Dr. KCaryl Comesand considered stable for discharge to home.  The patient was instructed to minimize her caffeine intake.   Physical Exam: Vitals:   03/30/17 2100 03/30/17 2134 03/31/17 0518 03/31/17 0924  BP: (!) 100/59 (!) 99/56 (!) 103/59 99/68  Pulse: 72 73 72 82  Resp: 18  16   Temp: 98.2 F (36.8 C)  98.4 F (36.9 C)   TempSrc: Oral  Oral   SpO2: 97%  98%   Weight:      Height:          Labs:   Lab Results  Component Value Date   WBC 6.4 03/30/2017   HGB 12.6 03/30/2017   HCT 37.6 03/30/2017   MCV 90.2 03/30/2017   PLT 177 03/30/2017     Recent Labs Lab 03/30/17 1154  NA 137  K 3.3*  CL 105  CO2 22  BUN 12  CREATININE 0.72  CALCIUM 9.4  GLUCOSE 97    Discharge Medications:  Allergies as of 03/31/2017      Reactions   Wheat Bran Other (See Comments)   All gluten      Medication List    TAKE these medications   clonazePAM 1 MG tablet Commonly known as:  KLONOPIN Take 1 mg by mouth daily as  needed for anxiety.   loratadine 10 MG tablet Commonly known as:  CLARITIN Take 10 mg by mouth daily as needed for allergies.   multivitamin tablet Take 1 tablet by mouth daily.   propranolol 40 MG tablet Commonly known as:  INDERAL Take 1 tablet (40 mg total) by mouth daily as needed (palpitations).   sertraline 100 MG tablet Commonly known as:  ZOLOFT Take 100 mg by mouth daily at 2 PM.       Disposition:  Home   Follow-up Information    [facility] Follow up.   Specialty:  Cardiology Why:  You will be called by a scheduler at our office to schedule your echocardiogram (heart ultrasound) Contact information: [address], [address] [place]       Baldwin Jamaica, PA-C Follow up.   Specialty:  Cardiology Why:  You will be  called by an office scheduler to make your follow up appointment Contact information: [place] [address] [phone]           Duration of Discharge Encounter: Greater than 30 minutes including physician time.  Venetia Night, PA-C 03/31/2017 5:46 PM

## 2017-03-31 NOTE — Consult Note (Signed)
ELECTROPHYSIOLOGY CONSULT NOTE    Patient ID: Terry [name] MRN: [id number], DOB/AGE: 02-19-1981 36 y.o.  Admit date: 03/30/2017 Date of Consult: 03/31/2017   Primary Physician: Merrilee Seashore, MD Primary Cardiologist: Dr. Caryl Comes  Reason for Consultation: AFib, SVT  HPI: Terry [name] is a 36 y.o. female who is being seen today for the evaluation of AFib, SVT at the request of Dr. Wynonia Lawman.  PMHx includes: celiac disease, migraines, hx of dysautonomia (clinically resolved) last seen by Dr. Caryl Comes in 2012, then mentioned a long-standing history of orthostatic intolerance, shower intolerance, Jacuzzi intolerance who in the last trimester of her second pregnancy back then had significant problems with symptoms though had much improved by that visit and her atenolol was discontinued.  She was brought to [facility] ER via EMS yesterday with c/o acute onset of dizziness/weakness like she may faint, she felt her hear "all over the place, fast, irregular", nothing like her historical symptoms with POTS.  From this she has been feeling well.  She had no CP or overt feelings of SOB.  Today she has a headache, she reports she feels is from not having caffeine this morning,m RN has just prior to me seeinmg hier given her a cup of regular coffee.  EMS record is reviewed,  Narrative reports initial rhythm by 3-lead ECG is VTach w/pulse rate over 200 vagal maneuvors attempted without change >> IV access obtained and associated rhythm change >> 12 Lead AFib rate 150 >> 12 lead SR  Here she was restarted on her Atenolol, in review of cardiology consult, Dr. Wynonia Lawman reported : "Her initial rhythm strip showed what looked like SVT with some Joellyn Haff and see toward the end of it.  A troponin EKG done by EMS prior to starting the IV showed atrial fibrillation with rapid ventricular response.  Subsequent EKG was normal"  LABS: K+ 3.3 (replaced) BUN/Creat 12/0.72 poc Trop 0.01 Serum HCG <1 WBC 6.4 H/H  12.6/37.6 plts 177 TSH 1.060  Past Medical History:  Diagnosis Date  . Celiac disease   . IBS (irritable bowel syndrome)   . Migraine   . Mononucleosis   . Tachycardia   . TMJ disease      Surgical History:  Past Surgical History:  Procedure Laterality Date  . ABDOMINOPLASTY    . CESAREAN SECTION    . LAPAROSCOPY    . TOOTH EXTRACTION       Facility-Administered Medications Prior to Admission  Medication Dose Route Frequency Provider Last Rate Last Dose  . 0.9 %  sodium chloride infusion  500 mL Intravenous Continuous Irene Shipper, MD       Prescriptions Prior to Admission  Medication Sig Dispense Refill Last Dose  . clonazePAM (KLONOPIN) 1 MG tablet Take 1 mg by mouth daily as needed for anxiety.    03/29/2017 at Unknown time  . loratadine (CLARITIN) 10 MG tablet Take 10 mg by mouth daily as needed for allergies.   03/30/2017 at Unknown time  . Multiple Vitamin (MULTIVITAMIN) tablet Take 1 tablet by mouth daily.   03/30/2017 at Unknown time  . sertraline (ZOLOFT) 100 MG tablet Take 100 mg by mouth daily at 2 PM.   03/30/2017 at Unknown time    Inpatient Medications:  . atenolol  12.5 mg Oral BID  . clonazePAM  1 mg Oral BID  . multivitamin with minerals  1 tablet Oral Daily  . sertraline  100 mg Oral Q breakfast    Allergies:  Allergies  Allergen Reactions  .  Wheat Bran Other (See Comments)    All gluten    Social History   Social History  . Marital status: Married    Spouse name: N/A  . Number of children: 2  . Years of education: N/A   Occupational History  . Corporate treasurer    Social History Main Topics  . Smoking status: Never Smoker  . Smokeless tobacco: Never Used  . Alcohol use No  . Drug use: No  . Sexual activity: Yes    Partners: Male    Birth control/ protection: Surgical     Comment: vasectomy   Other Topics Concern  . Not on file   Social History Narrative  . No narrative on file     Family History  Problem Relation Age of  Onset  . Hypertension Unknown   . Aneurysm Maternal Grandmother   . Prostate cancer Maternal Grandfather   . Heart attack Paternal Grandmother   . Colon cancer Neg Hx   . Stomach cancer Neg Hx   . Rectal cancer Neg Hx   . Esophageal cancer Neg Hx   . Liver cancer Neg Hx      Review of Systems: All other systems reviewed and are otherwise negative except as noted above.  Physical Exam: Vitals:   03/30/17 2100 03/30/17 2134 03/31/17 0518 03/31/17 0924  BP: (!) 100/59 (!) 99/56 (!) 103/59 99/68  Pulse: 72 73 72 82  Resp: 18  16   Temp: 98.2 F (36.8 C)  98.4 F (36.9 C)   TempSrc: Oral  Oral   SpO2: 97%  98%   Weight:      Height:        GEN- The patient is nauseous, c/o migraine headache, alert and oriented x 3 today.  Curled up in a ball vomitng HEENT: normocephalic, atraumatic; sclera clear, conjunctiva pink; hearing intact; oropharynx clear; neck supple, no JVP Lymph- no cervical lymphadenopathy Lungs- CTA b/l, normal work of breathing.  No wheezes, rales, rhonchi Heart-  RRR, no murmurs, rubs or gallops, PMI not laterally displaced GI- soft, non-tender, non-distended Extremities- no clubbing, cyanosis,  or edema MS- no significant deformity or atrophy Skin- warm and dry, no rash or lesion Psych- euthymic mood, full affect Neuro- no gross deficits observed  Labs:   Lab Results  Component Value Date   WBC 6.4 03/30/2017   HGB 12.6 03/30/2017   HCT 37.6 03/30/2017   MCV 90.2 03/30/2017   PLT 177 03/30/2017    Recent Labs Lab 03/30/17 1154  NA 137  K 3.3*  CL 105  CO2 22  BUN 12  CREATININE 0.72  CALCIUM 9.4  GLUCOSE 97      Radiology/Studies: No results found.  Reviewed by myself: EKG: SR, 97bpm, PR 11m, QRS 889m QTc 42822mMS tracings reviewed by myself, all appear to be SR TELEMETRY: SR  Echo is ordered, pending   Assessment and Plan:   1. Palpitations associated with near syncope/weakness   Initial rhythm by 3 lead tracing looks  like an SVT rate recored at 167bpm > AFib 1st 212 leadis AFib 144 bpm > SR all remaining tracings   CHA2DS2Vasc is one for female I will review with Dr. KleCaryl ComesT likely provoked the AF, she has no known hx of SVT historically, await Dr. KleCaryl Comesikely no particular therapy at this time unless it becomes recurrent   2. Headache associated with nausea presumed migraine     S/p Tylenol and at the pt request Coffee which historically  is helpful for her     She has seen headache specialists historically, without any reliable therapies     Have asked medicine be consulted to help     Migraines reported as infrequent, Dr. Caryl Comes has ordered tx, would still like medicine to help.      Signed, Tommye Standard, PA-C 03/31/2017 9:57 AM  As above According to the patient, her tachycardia terminated twice, recurring prior to arrival of the EMS 2 unit.  This was not noted in the narratives or data sets  She has hx of flutters which have lasted a minute quite similar to the episode yesterday  Her Headache today was debilitating;  Her husband says they occur /q 6-8 months,  pts says less frequently  Strips reviewed  Personally  ECG #3 sinus without preexcitation  : Otherwise normal  . There is an R prime in lead V1 with a normal QRS duration  ECG # 2 Atrial fibrillation without preexcitation  Strip # 1 tachycardia-relatively narrow with termination into an irregular rhythm with a QRS morphology similar to the preceding beats for 2 beats and then a flurry of irregular wide complex beats with different axes.  Tachycardia  Presumed SVT  Atrial fibrillation secondary to tachycardia  Headache Migraine   I suspect the tachycardia is SVT. The irregularity of the first 2 beats following this termination suggests the onset of atrial fibrillation which was then confirmed. The irregular wide complex beats mostly have an axis similar to the tachycardia. Moreover, the patient does not recall that the  electrodes were removed although she is not sure hence, the QRS morphology of the tachycardia in leads 12 and 3 appears to be similar to the QRS morphology  on the standard ECG    it is unlikely that this is ventricular tachycardia. Hence, I think it is reasonable for her to go home. We have discussed diagnostic options which will include an echocardiogram and efforts to obtain more information from EMS. The event that we are unable to do the latter, given the wide complex tachycardia in the irregular beats, I would undertake a cardiac MRI in the signal averaged ECG looking for evidence to support a cardiomyopathic process from which might have emerged a ventricular tachycardia which might not be benign  We have discussed therapeutic options;   we will give her a prescription for propranolol and I have reviewed with her carotid sinus massage. We will arrange early follow-up.

## 2017-04-16 ENCOUNTER — Other Ambulatory Visit: Payer: Self-pay

## 2017-04-16 ENCOUNTER — Ambulatory Visit (HOSPITAL_COMMUNITY): Payer: 59 | Attending: Cardiovascular Disease

## 2017-04-16 DIAGNOSIS — I340 Nonrheumatic mitral (valve) insufficiency: Secondary | ICD-10-CM | POA: Diagnosis not present

## 2017-04-16 DIAGNOSIS — I471 Supraventricular tachycardia: Secondary | ICD-10-CM | POA: Insufficient documentation

## 2017-04-20 NOTE — Progress Notes (Signed)
Cardiology Office Note Date:  04/21/2017  Patient ID:  Terry [name], DOB 02-May-1981, MRN [id number] PCP:  Merrilee Seashore, MD  Electrophysiologist: Dr. Caryl Comes    Chief Complaint: hospital f/u  History of Present Illness: Terry [name] is a 36 y.o. female with history of celiac disease, migraines, hx of dysautonomia (clinically resolved), and anxiety that she uses Klonopin for PRN.  Most recently a brief hospital stay with SVT discharged 03/31/17. In review of records: she was found upon arrival to be in what Dr. Caryl Comes felt was an SVT that degraded to AFib and spontaneously to SR.  The patient makes mention of a second episode of the same tachycardia these EKGs we do not have.  After lengthy discussion, including pill in the pocket Inderal use, as well as ablation procedure.  It was decided to discharge her to f/u with the echo out patient as well as follow up visit in 2 weeks.  She will be Rx Inderal 8m PO daily PRN for palpitations.  Should she have recurrent tachycardia or want to revisit ablation we will plan for this.   She has done and felt well since her hospitalization.  She has not had any further palpitations or tachycardia.  No headaches.  She has reduced her caffeine intake.  Dr. KOlin Piathoughts at the time of her hospital consult: suspect the tachycardia is SVT. The irregularity of the first 2 beats following this termination suggests the onset of atrial fibrillation which was then confirmed. The irregular wide complex beats mostly have an axis similar to the tachycardia. Moreover, the patient does not recall that the electrodes were removed although she is not sure hence, the QRS morphology of the tachycardia in leads 12 and 3 appears to be similar to the QRS morphology  on the standard ECG    it is unlikely that this is ventricular tachycardia. Hence, I think it is reasonable for her to go home. We have discussed diagnostic options which will include an echocardiogram and  efforts to obtain more information from EMS. The event that we are unable to do the latter, given the wide complex tachycardia in the irregular beats, I would undertake a cardiac MRI in the signal averaged ECG looking for evidence to support a cardiomyopathic process from which might have emerged a ventricular tachycardia which might not be benign  We have discussed therapeutic options;   we will give her a prescription for propranolol and I have reviewed with her carotid sinus massage. We will arrange early follow-up.   Past Medical History:  Diagnosis Date  . Celiac disease   . IBS (irritable bowel syndrome)   . Migraine   . Mononucleosis   . Tachycardia   . TMJ disease     Past Surgical History:  Procedure Laterality Date  . ABDOMINOPLASTY    . CESAREAN SECTION    . LAPAROSCOPY    . TOOTH EXTRACTION      Current Outpatient Prescriptions  Medication Sig Dispense Refill  . clonazePAM (KLONOPIN) 1 MG tablet Take 1 mg by mouth as needed for anxiety.     .Marland Kitchenloratadine (CLARITIN) 10 MG tablet Take 10 mg by mouth daily as needed for allergies.    . Multiple Vitamin (MULTIVITAMIN) tablet Take 1 tablet by mouth daily.    . propranolol (INDERAL) 40 MG tablet Take 1 tablet (40 mg total) by mouth daily as needed (palpitations). 30 tablet 2  . sertraline (ZOLOFT) 100 MG tablet Take 100 mg by mouth daily  at 2 PM.     No current facility-administered medications for this visit.     Allergies:   Wheat bran   Social History:  The patient  reports that she has never smoked. She has never used smokeless tobacco. She reports that she does not drink alcohol or use drugs.   Family History:  The patient's family history includes Aneurysm in her maternal grandmother; Heart attack in her paternal grandmother; Prostate cancer in her maternal grandfather.  ROS:  Please see the history of present illness.  All other systems are reviewed and otherwise negative.   PHYSICAL EXAM:  VS:  BP 124/78    Pulse 74   Ht 5' 9"  (1.753 m)   Wt 157 lb (71.2 kg)   BMI 23.18 kg/m  BMI: Body mass index is 23.18 kg/m. Well nourished, well developed, in no acute distress  HEENT: normocephalic, atraumatic  Neck: no JVD, carotid bruits or masses Cardiac:  RRR; no significant murmurs, no rubs, or gallops Lungs:  CTA b/l, no wheezing, rhonchi or rales  Abd: soft, nontender MS: no deformity or atrophy Ext: no edema  Skin: warm and dry, no rash Neuro:  No gross deficits appreciated Psych: euthymic mood, full affect   EKG:   03/30/17 SR, 97bpm, PR 153m, QRS 826m QTc 42853mnitial EMS rhythm by 3 lead tracing looks like an SVT rate recored at 167bpm > AFib 1st 212 leadis AFib 144 bpm > SR all remaining tracings  04/16/17: TTE Study Conclusions - Left ventricle: The cavity size was normal. Systolic function was   normal. The estimated ejection fraction was in the range of 60%   to 65%. Wall motion was normal; there were no regional wall   motion abnormalities. Left ventricular diastolic function   parameters were normal. - Mitral valve: There was mild regurgitation. - Atrial septum: No defect or patent foramen ovale was identified.  Recent Labs: 11/20/2016: ALT 8 03/30/2017: BUN 12; Creatinine, Ser 0.72; Hemoglobin 12.6; Magnesium 1.9; Platelets 177; Potassium 3.3; Sodium 137; TSH 1.060  No results found for requested labs within last 8760 hours.   CrCl cannot be calculated (Patient's most recent lab result is older than the maximum 21 days allowed.).   Wt Readings from Last 3 Encounters:  04/21/17 157 lb (71.2 kg)  03/30/17 152 lb 6.4 oz (69.1 kg)  11/22/16 164 lb (74.4 kg)     Other studies reviewed: Additional studies/records reviewed today include: summarized above  ASSESSMENT AND PLAN:  1. SVT (wide complex beats were observedDr. KleCaryl Comeslt unlikely to be VT)     AFib was felt secondary to degeneration of her SVT     CHA2DS2Vasc is one for female, no a/c     She has her inderal if  needed.  Will send her for cardiac MRI and signal averaged EKG as recommended by Dr. KleCaryl ComesDisposition: F/u with us [place] 4 months, sooner if needed, and pending test results  Current medicines are reviewed at length with the patient today.  The patient did not have any concerns regarding medicines.  SigHaywood LassoA-C 04/21/2017 10:10 AM     [facility] [address] [place] [phone])  ([phone])

## 2017-04-21 ENCOUNTER — Other Ambulatory Visit: Payer: Self-pay

## 2017-04-21 ENCOUNTER — Ambulatory Visit (INDEPENDENT_AMBULATORY_CARE_PROVIDER_SITE_OTHER): Payer: 59 | Admitting: Physician Assistant

## 2017-04-21 ENCOUNTER — Ambulatory Visit (HOSPITAL_COMMUNITY)
Admission: RE | Admit: 2017-04-21 | Discharge: 2017-04-21 | Disposition: A | Discharge status: Home / Self Care | Payer: 59 | Source: Ambulatory Visit | Attending: Physician Assistant | Admitting: Physician Assistant

## 2017-04-21 VITALS — BP 124/78 | HR 74 | Ht 69.0 in | Wt 157.0 lb

## 2017-04-21 DIAGNOSIS — R000 Tachycardia, unspecified: Secondary | ICD-10-CM | POA: Diagnosis not present

## 2017-04-21 DIAGNOSIS — I471 Supraventricular tachycardia: Secondary | ICD-10-CM | POA: Diagnosis not present

## 2017-04-21 NOTE — Patient Instructions (Addendum)
Medication Instructions:   Your physician recommends that you continue on your current medications as directed. Please refer to the Current Medication list given to you today.   If you need a refill on your cardiac medications before your next appointment, please call your pharmacy.  Labwork: NONE ORDERED  TODAY   Testing/Procedures: Your physician has requested that you have a cardiac MRI. Cardiac MRI uses a computer to create images of your heart as its beating, producing both still and moving pictures of your heart and major blood vessels. For further information please visit [email or web address] Please follow the instruction sheet given to you today for more information. SOME WILL CONTACT YOU BACK AFTER PRE-CERTIFCATION PROCESS OF PROCEDURE IS COMPLETED.   AN SINGLE AVERAGE EKG  AT [facility]   Follow-Up:  IN 4 MONTHS WITH KLEIN    Any Other Special Instructions Will Be Listed Below (If Applicable).

## 2017-04-23 NOTE — Addendum Note (Signed)
Addended by: Claude Manges on: 04/23/2017 07:23 AM   Modules accepted: Orders

## 2017-04-24 ENCOUNTER — Encounter: Payer: Self-pay | Admitting: Physician Assistant

## 2017-04-24 ENCOUNTER — Telehealth: Payer: Self-pay | Admitting: Internal Medicine

## 2017-04-24 NOTE — Telephone Encounter (Signed)
I spoke with the patient and made her aware Dr. Caryl Comes had reviewed her Signal Averaged EKG and this was normal. Patient verbalized understanding.

## 2017-05-05 ENCOUNTER — Encounter (HOSPITAL_COMMUNITY): Payer: Self-pay

## 2017-05-05 ENCOUNTER — Ambulatory Visit (HOSPITAL_COMMUNITY)
Admission: RE | Admit: 2017-05-05 | Discharge: 2017-05-05 | Disposition: A | Discharge status: Home / Self Care | Payer: 59 | Source: Ambulatory Visit | Attending: Physician Assistant | Admitting: Physician Assistant

## 2017-05-05 DIAGNOSIS — I472 Ventricular tachycardia: Secondary | ICD-10-CM | POA: Diagnosis not present

## 2017-05-05 DIAGNOSIS — R000 Tachycardia, unspecified: Secondary | ICD-10-CM | POA: Diagnosis not present

## 2017-05-05 MED ORDER — GADOBENATE DIMEGLUMINE 529 MG/ML IV SOLN
24.0000 mL | Freq: Once | INTRAVENOUS | Status: AC | PRN
  Administered 2017-05-05: 24 mL via INTRAVENOUS

## 2017-05-08 ENCOUNTER — Telehealth: Payer: Self-pay | Admitting: Internal Medicine

## 2017-05-08 NOTE — Telephone Encounter (Signed)
New message    Pt is calling about results from CT she had done.

## 2017-05-08 NOTE — Telephone Encounter (Signed)
I spoke with the patient- she was calling for her MRI results, but states that she was already notified of these.

## 2017-05-12 DIAGNOSIS — Z01419 Encounter for gynecological examination (general) (routine) without abnormal findings: Secondary | ICD-10-CM | POA: Diagnosis not present

## 2017-05-12 DIAGNOSIS — Z0000 Encounter for general adult medical examination without abnormal findings: Secondary | ICD-10-CM | POA: Diagnosis not present

## 2017-05-12 DIAGNOSIS — J45990 Exercise induced bronchospasm: Secondary | ICD-10-CM | POA: Diagnosis not present

## 2017-10-20 ENCOUNTER — Telehealth: Payer: Self-pay | Admitting: Internal Medicine

## 2017-10-20 NOTE — Telephone Encounter (Signed)
Kate Is calling because she has a Question about her Medication ( Propranolol 40 mg ).. Please call

## 2017-10-20 NOTE — Telephone Encounter (Signed)
Spoke with pt who is reporting she has been having more frequent "flutters" and has been taking propranolol 40 mg 1/2 tablet every night for 2 weeks now.  She reports feeling less fluttering and her HR has been decreased from 90-130 bpm to 70-110 bpm according her Apple watch.  She would like to continue with the medication long term if OK.  She also reports she used to take Atenolol for fluttering during her pregnancy and wonders if this would be a better long term medication for her or if to take propranolol long term.  Advised I will forward this information to Dr Caryl Comes for his review, knowledge and c/b with any recommendations/orders.  She states understanding.

## 2017-10-22 NOTE — Telephone Encounter (Signed)
Not clear which is best long term, but is she is NOT pregnant atenolol is ok and we can try If she wishes

## 2017-10-23 NOTE — Telephone Encounter (Signed)
Pt aware ok to try Atenolol and she would like to.  She reports she is not pregnant.  According to her chart she was taking 25 mg once a day.  Will obtain order from Dr Caryl Comes for dosage.  Pt aware RX will be sent into her pharmacy ([facility] [facility]) once order obtained.

## 2017-11-12 NOTE — Telephone Encounter (Signed)
Pt is requesting Rx for Atenolol.  Aware will attempt to obtain RX from Dr Caryl Comes.

## 2017-11-13 MED ORDER — ATENOLOL 25 MG PO TABS: 25.0000 mg | ORAL_TABLET | Freq: Every day | ORAL | 3 refills | Status: DC

## 2017-11-13 NOTE — Addendum Note (Signed)
Addended by: Campbell Riches on: 11/13/2017 03:51 PM   Modules accepted: Orders

## 2017-11-13 NOTE — Telephone Encounter (Signed)
Spoke with Dr. Caryl Comes who is agreeable to patient take 25 mg QD of Atenolol Pt will discontinue propranolol She is aware and agreeable  [facility] to [facility] per patient request

## 2017-11-24 ENCOUNTER — Encounter: Payer: Self-pay | Admitting: Internal Medicine

## 2018-01-06 ENCOUNTER — Ambulatory Visit: Payer: 59 | Admitting: Internal Medicine

## 2018-01-15 ENCOUNTER — Encounter: Payer: 59 | Admitting: Internal Medicine

## 2018-01-22 ENCOUNTER — Encounter: Payer: Self-pay | Admitting: Family Medicine

## 2018-07-14 ENCOUNTER — Ambulatory Visit (INDEPENDENT_AMBULATORY_CARE_PROVIDER_SITE_OTHER): Payer: 59 | Admitting: Family Medicine

## 2018-07-14 ENCOUNTER — Encounter: Payer: Self-pay | Admitting: Family Medicine

## 2018-07-14 VITALS — BP 130/79 | HR 88 | Ht 69.0 in | Wt 183.0 lb

## 2018-07-14 DIAGNOSIS — K589 Irritable bowel syndrome without diarrhea: Secondary | ICD-10-CM | POA: Insufficient documentation

## 2018-07-14 DIAGNOSIS — K9041 Non-celiac gluten sensitivity: Secondary | ICD-10-CM

## 2018-07-14 DIAGNOSIS — F5104 Psychophysiologic insomnia: Secondary | ICD-10-CM | POA: Diagnosis not present

## 2018-07-14 DIAGNOSIS — F411 Generalized anxiety disorder: Secondary | ICD-10-CM

## 2018-07-14 DIAGNOSIS — K900 Celiac disease: Secondary | ICD-10-CM | POA: Insufficient documentation

## 2018-07-14 DIAGNOSIS — I471 Supraventricular tachycardia: Secondary | ICD-10-CM | POA: Insufficient documentation

## 2018-07-14 NOTE — Patient Instructions (Signed)
Please take half a milligram of clonazepam nightly for approximately 8 days then try to go to 1/4 tablet for 8 days.  And then 1/4 tablet every other day for about 8 days then off.  Over-the-counter melatonin may be used Sleep Aid.  Also in 6 weeks we will follow-up and see how you are doing and if need be, we will discuss your labs from work as well as other medicines we could possibly start you on if you are having difficulties.  Also remember your maximum heart rate is 181 to try to exercise at 60 to 70% of your max heart rate.  Try to go for 30 minutes 5 days a week.  Using your watch continue to monitor your heart rate and this should give you reassurance.      If you have insomnia or difficulty sleeping, this information is for you:  - Avoid caffeinated beverages after lunch,  no alcoholic beverages,  no eating within 2-3 hours of lying down,  avoid exposure to blue light before bed,  avoid daytime naps, and  needs to maintain a regular sleep schedule- go to sleep and wake up around the same time every night.   - Resolve concerns or worries before entering bedroom:  Discussed relaxation techniques with patient and to keep a journal to write down fears\ worries.  I suggested seeing a counselor for CBT.   - Recommend patient meditate or do deep breathing exercises to help relax.   Incorporate the use of white noise machines or listen to "sleep meditation music", or recordings of guided meditations for sleep from YouTube which are free, such as  "guided meditation for detachment from over thinking"  by Mayford Knife.     Please realize, EXERCISE IS MEDICINE!  -  American Heart Association [facility]) guidelines for exercise : If you are in good health, without any medical conditions, you should engage in 150 minutes of moderate intensity aerobic activity per week.  This means you should be huffing and puffing throughout your workout.   Engaging in regular exercise will improve brain function and  memory, as well as improve mood, boost immune system and help with weight management.  As well as the other, more well-known effects of exercise such as decreasing blood sugar levels, decreasing blood pressure,  and decreasing bad cholesterol levels/ increasing good cholesterol levels.     -  The AHA strongly endorses consumption of a diet that contains a variety of foods from all the food categories with an emphasis on fruits and vegetables; fat-free and low-fat dairy products; cereal and grain products; legumes and nuts; and fish, poultry, and/or extra lean meats.    Excessive food intake, especially of foods high in saturated and trans fats, sugar, and salt, should be avoided.    Adequate water intake of roughly 1/2 of your weight in pounds, should equal the ounces of water per day you should drink.  So for instance, if you're 200 pounds, that would be 100 ounces of water per day.         Mediterranean Diet  Why follow it? Research shows. . Those who follow the Mediterranean diet have a reduced risk of heart disease  . The diet is associated with a reduced incidence of Parkinson's and Alzheimer's diseases . People following the diet may have longer life expectancies and lower rates of chronic diseases  . The Dietary Guidelines for Americans recommends the Mediterranean diet as an eating plan to promote health and prevent  disease  What Is the Mediterranean Diet?  . Healthy eating plan based on typical foods and recipes of Mediterranean-style cooking . The diet is primarily a plant based diet; these foods should make up a majority of meals   Starches - Plant based foods should make up a majority of meals - They are an important sources of vitamins, minerals, energy, antioxidants, and fiber - Choose whole grains, foods high in fiber and minimally processed items  - Typical grain sources include wheat, oats, barley, corn, brown rice, bulgar, farro, millet, polenta, couscous  - Various types of  beans include chickpeas, lentils, fava beans, black beans, white beans   Fruits  Veggies - Large quantities of antioxidant rich fruits & veggies; 6 or more servings  - Vegetables can be eaten raw or lightly drizzled with oil and cooked  - Vegetables common to the traditional Mediterranean Diet include: artichokes, arugula, beets, broccoli, brussel sprouts, cabbage, carrots, celery, collard greens, cucumbers, eggplant, kale, leeks, lemons, lettuce, mushrooms, okra, onions, peas, peppers, potatoes, pumpkin, radishes, rutabaga, shallots, spinach, sweet potatoes, turnips, zucchini - Fruits common to the Mediterranean Diet include: apples, apricots, avocados, cherries, clementines, dates, figs, grapefruits, grapes, melons, nectarines, oranges, peaches, pears, pomegranates, strawberries, tangerines  Fats - Replace butter and margarine with healthy oils, such as olive oil, canola oil, and tahini  - Limit nuts to no more than a handful a day  - Nuts include walnuts, almonds, pecans, pistachios, pine nuts  - Limit or avoid candied, honey roasted or heavily salted nuts - Olives are central to the [employer] - can be eaten whole or used in a variety of dishes   Meats Protein - Limiting red meat: no more than a few times a month - When eating red meat: choose lean cuts and keep the portion to the size of deck of cards - Eggs: approx. 0 to 4 times a week  - Fish and lean poultry: at least 2 a week  - Healthy protein sources include, chicken, [place], lean beef, lamb - Increase intake of seafood such as tuna, salmon, trout, mackerel, shrimp, scallops - Avoid or limit high fat processed meats such as sausage and bacon  Dairy - Include moderate amounts of low fat dairy products  - Focus on healthy dairy such as fat free yogurt, skim milk, low or reduced fat cheese - Limit dairy products higher in fat such as whole or 2% milk, cheese, ice cream  Alcohol - Moderate amounts of red wine is ok  - No more than  5 oz daily for women (all ages) and men older than age 24  - No more than 10 oz of wine daily for men younger than 44  Other - Limit sweets and other desserts  - Use herbs and spices instead of salt to flavor foods  - Herbs and spices common to the traditional Mediterranean Diet include: basil, bay leaves, chives, cloves, cumin, fennel, garlic, lavender, marjoram, mint, oregano, parsley, pepper, rosemary, sage, savory, sumac, tarragon, thyme   It's not just a diet, it's a lifestyle:  . The Mediterranean diet includes lifestyle factors typical of those in the region  . Foods, drinks and meals are best eaten with others and savored . Daily physical activity is important for overall good health . This could be strenuous exercise like running and aerobics . This could also be more leisurely activities such as walking, housework, yard-work, or taking the stairs . Moderation is the key; a balanced and healthy diet accommodates  most foods and drinks . Consider portion sizes and frequency of consumption of certain foods   Meal Ideas & Options:  . Breakfast:  o Whole wheat toast or whole wheat English muffins with peanut butter & hard boiled egg o Steel cut oats topped with apples & cinnamon and skim milk  o Fresh fruit: banana, strawberries, melon, berries, peaches  o Smoothies: strawberries, bananas, greek yogurt, peanut butter o Low fat greek yogurt with blueberries and granola  o Egg white omelet with spinach and mushrooms o Breakfast couscous: whole wheat couscous, apricots, skim milk, cranberries  . Sandwiches:  o Hummus and grilled vegetables (peppers, zucchini, squash) on whole wheat bread   o Grilled chicken on whole wheat pita with lettuce, tomatoes, cucumbers or tzatziki  o Tuna salad on whole wheat bread: tuna salad made with greek yogurt, olives, red peppers, capers, green onions o Garlic rosemary lamb pita: lamb sauted with garlic, rosemary, salt & pepper; add lettuce, cucumber,  greek yogurt to pita - flavor with lemon juice and black pepper  . Seafood:  o Mediterranean grilled salmon, seasoned with garlic, basil, parsley, lemon juice and black pepper o Shrimp, lemon, and spinach whole-grain pasta salad made with low fat greek yogurt  o Seared scallops with lemon orzo  o Seared tuna steaks seasoned salt, pepper, coriander topped with tomato mixture of olives, tomatoes, olive oil, minced garlic, parsley, green onions and cappers  . Meats:  o Herbed greek chicken salad with kalamata olives, cucumber, feta  o Red bell peppers stuffed with spinach, bulgur, lean ground beef (or lentils) & topped with feta   o Kebabs: skewers of chicken, tomatoes, onions, zucchini, squash  o [place] burgers: made with red onions, mint, dill, lemon juice, feta cheese topped with roasted red peppers . Vegetarian o Cucumber salad: cucumbers, artichoke hearts, celery, red onion, feta cheese, tossed in olive oil & lemon juice  o Hummus and whole grain pita points with a greek salad (lettuce, tomato, feta, olives, cucumbers, red onion) o Lentil soup with celery, carrots made with vegetable broth, garlic, salt and pepper  o Tabouli salad: parsley, bulgur, mint, scallions, cucumbers, tomato, radishes, lemon juice, olive oil, salt and pepper.

## 2018-07-14 NOTE — Progress Notes (Signed)
New patient office visit note:  Impression and Recommendations:    1. GAD (generalized anxiety disorder)   2. Psychophysiological insomnia   3. Celiac disease   4. Gluten intolerance   5. h/o Paroxysmal SVT (supraventricular tachycardia) (HCC)     -Discussed with patient we are weaning clonazepam.  Explained that that is not something I give regularly and if she needs this she will need to go to a psychiatrist for further treatment.  -We discussed continuing Zoloft for her generalized anxiety disorder and eating increasing it or we could add BuSpar in the future if needed for excess anxiety\ panic otherwise  -Also we discussed sleep hygiene insomnia.  -Her paroxysms of SVT has been stable and she continues on atenolol per her cardiologist Dr. Adam Phenix.\  -I recommend she follow-up in the near future for fasting lab work and then office visit with me.  She can do this as needed if she is due for a yearly physical as well.    Education and routine counseling performed. Handouts provided.  No orders of the defined types were placed in this encounter.   There are no discontinued medications.  Gross side effects, risk and benefits, and alternatives of medications discussed with patient.  Patient is aware that all medications have potential side effects and we are unable to predict every side effect or drug-drug interaction that may occur.  Expresses verbal understanding and consents to current therapy plan and treatment regimen.  Return for  6 wk f/up sleep since wenaing clonazepam.  Please see AVS handed out to patient at the end of our visit for further patient instructions/ counseling done pertaining to today's office visit.    Note: This document was prepared using Dragon voice recognition software and may include unintentional dictation errors.     Terry [name] 08/09/18 9:59  PM   ----------------------------------------------------------------------------------------------------------------------    Subjective:    Chief complaint:   Chief Complaint  Patient presents with  . Establish Care     HPI: Terry [name] is a pleasant 37 y.o. female who presents to [place] [facility] today to review their medical history with me and establish care.   I asked the patient to review their chronic problem list with me to ensure everything was updated and accurate.    All recent office visits with other providers, any medical records that patient brought in etc  - I reviewed today.     We asked pt to get [place] their medical records from [facility] providers/ specialists that they had seen within the past 3-5 years- if they are in private practice and/or do not work for [employer], [facility], [place], [place] or [employer] owned practice.  Told them to call their specialists to clarify this if they are not sure.    Wt Readings from Last 3 Encounters:  08/06/18 183 lb (83 kg)  07/14/18 183 lb (83 kg)  04/21/17 157 lb (71.2 kg)   BP Readings from Last 3 Encounters:  08/06/18 120/78  07/14/18 130/79  04/21/17 124/78   Pulse Readings from Last 3 Encounters:  08/06/18 73  07/14/18 88  04/21/17 74   BMI Readings from Last 3 Encounters:  08/06/18 27.02 kg/m  07/14/18 27.02 kg/m  04/21/17 23.18 kg/m    Patient Care Team    Relationship Specialty Notifications Start End  Terry Dance, DO PCP - General Family Medicine  07/14/18   Deboraha Sprang, MD Consulting Physician Cardiology  07/14/18     Patient Active Problem List   Diagnosis Date Noted  . IBS (irritable bowel syndrome) 07/14/2018  . GAD (generalized anxiety disorder) 07/14/2018  . Celiac disease 07/14/2018  . Gluten intolerance 07/14/2018  . Psychophysiological insomnia 07/14/2018  . h/o Paroxysmal SVT (supraventricular tachycardia) ([place]) 07/14/2018  . Rapid heart rate 03/30/2017  .  Dysautonomia-orthostatic intolerance 04/08/2011  . Sinus tachycardia 04/08/2011  . Pregnancy, third trimester 04/08/2011     Past Medical History:  Diagnosis Date  . Celiac disease   . IBS (irritable bowel syndrome)   . Migraine   . Mononucleosis   . Tachycardia   . TMJ disease      Past Medical History:  Diagnosis Date  . Celiac disease   . IBS (irritable bowel syndrome)   . Migraine   . Mononucleosis   . Tachycardia   . TMJ disease      Past Surgical History:  Procedure Laterality Date  . ABDOMINOPLASTY    . CESAREAN SECTION    . LAPAROSCOPY    . TOOTH EXTRACTION       Family History  Problem Relation Age of Onset  . Hypertension Unknown   . Aneurysm Maternal Grandmother   . Prostate cancer Maternal Grandfather   . Heart attack Paternal Grandmother   . Colon cancer Neg Hx   . Stomach cancer Neg Hx   . Rectal cancer Neg Hx   . Esophageal cancer Neg Hx   . Liver cancer Neg Hx      Social History   Substance and Sexual Activity  Drug Use No     Social History   Substance and Sexual Activity  Alcohol Use Yes  . Alcohol/week: 12.0 standard drinks  . Types: 12 Standard drinks or equivalent per week     Social History   Tobacco Use  Smoking Status Never Smoker  Smokeless Tobacco Never Used     Current Meds  Medication Sig  . clonazePAM (KLONOPIN) 0.25 MG disintegrating tablet Take 0.5 mg by mouth daily.   Marland Kitchen loratadine (CLARITIN) 10 MG tablet Take 10 mg by mouth daily as needed for allergies.  . Multiple Vitamin (MULTIVITAMIN) tablet Take 1 tablet by mouth daily.  . [DISCONTINUED] sertraline (ZOLOFT) 100 MG tablet Take 100 mg by mouth daily.     Allergies: Wheat bran   Review of Systems  Constitutional: Negative for chills, diaphoresis, fever, malaise/fatigue and weight loss.  HENT: Negative for congestion, sore throat and tinnitus.   Eyes: Negative for blurred vision, double vision and photophobia.  Respiratory: Negative for cough  and wheezing.   Cardiovascular: Positive for palpitations. Negative for chest pain.  Gastrointestinal: Negative for blood in stool, diarrhea, nausea and vomiting.  Genitourinary: Negative for dysuria, frequency and urgency.  Musculoskeletal: Negative for joint pain and myalgias.  Skin: Negative for itching and rash.  Neurological: Negative for dizziness, focal weakness, weakness and headaches.  Endo/Heme/Allergies: Negative for environmental allergies and polydipsia. Does not bruise/bleed easily.  Psychiatric/Behavioral: Negative for depression and memory loss. The patient is nervous/anxious. The patient does not have insomnia.      Objective:   Blood pressure 130/79, pulse 88, height 5' 9"  (1.753 m), weight 183 lb (83 kg), last menstrual period 06/27/2018, SpO2 99 %. Body mass index is 27.02 kg/m. General: Well Developed, well nourished, and in no acute distress.  Neuro: Alert and oriented x3, extra-ocular muscles intact, sensation grossly intact.  HEENT:[place]/AT, PERRLA, neck supple, No carotid bruits Skin: no gross rashes  Cardiac:  Regular rate and rhythm Respiratory: Essentially clear to auscultation bilaterally. Not using accessory muscles, speaking in full sentences.  Abdominal: not grossly distended Musculoskeletal: Ambulates w/o diff, FROM * 4 ext.  Vasc: less 2 sec cap RF, warm and pink  Psych:  No HI/SI, judgement and insight good, Euthymic mood. Full Affect.    No results found for this or any previous visit (from the past 2160 hour(s)).

## 2018-07-15 ENCOUNTER — Encounter: Payer: Self-pay | Admitting: Family Medicine

## 2018-07-22 ENCOUNTER — Encounter: Payer: Self-pay | Admitting: Physician Assistant

## 2018-07-29 ENCOUNTER — Other Ambulatory Visit: Payer: Self-pay

## 2018-07-29 ENCOUNTER — Encounter: Payer: Self-pay | Admitting: Family Medicine

## 2018-07-29 MED ORDER — SERTRALINE HCL 100 MG PO TABS: 100.0000 mg | ORAL_TABLET | Freq: Every day | ORAL | 1 refills | Status: DC

## 2018-07-29 NOTE — Telephone Encounter (Signed)
Patient is requesting a refill on Sertraline, medication was last fill by a pervious provider.  LOV 07/14/2018 (est care). Patient has a follow up on 08/25/2018.  Please review and advise. MPulliam, CMA/RT(R)

## 2018-07-30 ENCOUNTER — Other Ambulatory Visit: Payer: Self-pay

## 2018-07-30 DIAGNOSIS — F411 Generalized anxiety disorder: Secondary | ICD-10-CM

## 2018-07-30 MED ORDER — SERTRALINE HCL 100 MG PO TABS: 100.0000 mg | ORAL_TABLET | Freq: Every day | ORAL | 1 refills | Status: DC

## 2018-08-05 ENCOUNTER — Telehealth: Payer: Self-pay | Admitting: Family Medicine

## 2018-08-05 ENCOUNTER — Other Ambulatory Visit: Payer: Self-pay

## 2018-08-05 NOTE — Telephone Encounter (Signed)
Patient requesting refill on Clonazepam.  Medication was last filled by a previous provider.  LOV 07/14/2018. Looked up patient on the [place] controlled substance website and no results found for the patient. Patient states that she is weaning off the medication. MPulliam, CMA/RT(R)

## 2018-08-05 NOTE — Progress Notes (Signed)
Cardiology Office Note Date:  08/05/2018  Patient ID:  Terry [name], DOB 1981/11/14, MRN [id number] PCP:  [name], DO  Electrophysiologist: Dr. Caryl Comes    Chief Complaint: hospital f/u  History of Present Illness: Terry [name] is a 37 y.o. female with history of celiac disease, migraines, hx of dysautonomia (clinically resolved), and anxiety that she uses Klonopin for PRN.  She had a brief hospital stay with SVT discharged 03/31/17. In review of records: she was found upon arrival to be in what Dr. Caryl Comes felt was an SVT that degraded to AFib and spontaneously to SR.  The patient makes mention of a second episode of the same tachycardia these EKGs we do not have.  After lengthy discussion, including pill in the pocket Inderal use, as well as ablation procedure.  It was decided to discharge her to f/u with the echo out patient as well as follow up visit in 2 weeks.  She will be Rx Inderal 73m PO daily PRN for palpitations.  Should she have recurrent tachycardia or want to revisit ablation we will plan for this.   Dr. KOlin Piathoughts at the time of her hospital consult: suspect the tachycardia is SVT. The irregularity of the first 2 beats following this termination suggests the onset of atrial fibrillation which was then confirmed. The irregular wide complex beats mostly have an axis similar to the tachycardia. Moreover, the patient does not recall that the electrodes were removed although she is not sure hence, the QRS morphology of the tachycardia in leads 12 and 3 appears to be similar to the QRS morphology  on the standard ECG  it is unlikely that this is ventricular tachycardia. Hence, I think it is reasonable for her to go home. We have discussed diagnostic options which will include an echocardiogram and efforts to obtain more information from EMS. The event that we are unable to do the latter, given the wide complex tachycardia in the irregular beats, I would undertake a cardiac MRI  in the signal averaged ECG looking for evidence to support a cardiomyopathic process from which might have emerged a ventricular tachycardia which might not be benign  We have discussed therapeutic options;   we will give her a prescription for propranolol and I have reviewed with her carotid sinus massage. We will arrange early follow-up.  I saw her in June 2018 in f/u, she had done and felt well since her hospitalization.  She had not had any further palpitations or tachycardia.  No headaches.  She has reduced her caffeine intake.  We planned for the signal averaged EKG which was normal, cMRI without abnormal findings either. Since that visit she was using her PRN propanolol fairly frequent for flutters, and in d/w Dr. KCaryl Comes started on daily atenolol.  Since then she has done very well. Very infrequent brief flutters that are momentary only, has not used the propanolol since being on the atenolol.  No CP, or SOB, no dizziness, near syncope or syncope.  She confirms she is not pregnant (given meds).  She sees her PMD at least annually for screening labs, lipids.  She has recently started back exercising, has a gym at work, purchased an eCivil engineer, contractingfor home.    Past Medical History:  Diagnosis Date  . Celiac disease   . IBS (irritable bowel syndrome)   . Migraine   . Mononucleosis   . Tachycardia   . TMJ disease     Past Surgical History:  Procedure Laterality  Date  . ABDOMINOPLASTY    . CESAREAN SECTION    . LAPAROSCOPY    . TOOTH EXTRACTION      Current Outpatient Medications  Medication Sig Dispense Refill  . atenolol (TENORMIN) 25 MG tablet Take 1 tablet (25 mg total) by mouth daily. (Patient taking differently: Take 25 mg by mouth at bedtime. ) 90 tablet 3  . clonazePAM (KLONOPIN) 1 MG tablet Take 0.5 mg by mouth daily.     Marland Kitchen loratadine (CLARITIN) 10 MG tablet Take 10 mg by mouth daily as needed for allergies.    . Multiple Vitamin (MULTIVITAMIN) tablet Take 1 tablet by  mouth daily.    . sertraline (ZOLOFT) 100 MG tablet Take 1 tablet (100 mg total) by mouth daily. 90 tablet 1   No current facility-administered medications for this visit.     Allergies:   Wheat bran   Social History:  The patient  reports that she has never smoked. She has never used smokeless tobacco. She reports that she drinks about 12.0 standard drinks of alcohol per week. She reports that she does not use drugs.   Family History:  The patient's family history includes Aneurysm in her maternal grandmother; Heart attack in her paternal grandmother; Hypertension in her unknown relative; Prostate cancer in her maternal grandfather.  ROS:  Please see the history of present illness.  All other systems are reviewed and otherwise negative.   PHYSICAL EXAM:  VS:  There were no vitals taken for this visit. BMI: There is no height or weight on file to calculate BMI. Well nourished, well developed, in no acute distress  HEENT: normocephalic, atraumatic  Neck: no JVD, carotid bruits or masses Cardiac:  RRR; no significant murmurs, no rubs, or gallops Lungs:  CTA b/l, no wheezing, rhonchi or rales  Abd: soft, nontender MS: no deformity or atrophy Ext: no edema  Skin: warm and dry, no rash Neuro:  No gross deficits appreciated Psych: euthymic mood, full affect   EKG:  Done today and reviewed by myself: SR, 73bpm  03/30/17 SR, 97bpm, PR 116m, QRS 875m QTc 42848mnitial EMS rhythm by 3 lead tracing looks like an SVT rate recored at 167bpm > AFib 1st 212 leadis AFib 144 bpm > SR all remaining tracings  05/05/17: cMRI IMPRESSION: 1.  Normal LV size and systolic function, EF 65%16%.  Normal RV size and systolic function.  No evidence for ARVC. 3. No myocardial LGE, so no definitive evidence for prior MI, infiltrative disease, or myocarditis  04/16/17: TTE Study Conclusions - Left ventricle: The cavity size was normal. Systolic function was   normal. The estimated ejection fraction was  in the range of 60%   to 65%. Wall motion was normal; there were no regional wall   motion abnormalities. Left ventricular diastolic function   parameters were normal. - Mitral valve: There was mild regurgitation. - Atrial septum: No defect or patent foramen ovale was identified.  Recent Labs: No results found for requested labs within last 8760 hours.  No results found for requested labs within last 8760 hours.   CrCl cannot be calculated (Patient's most recent lab result is older than the maximum 21 days allowed.).   Wt Readings from Last 3 Encounters:  07/14/18 183 lb (83 kg)  04/21/17 157 lb (71.2 kg)  03/30/17 152 lb 6.4 oz (69.1 kg)     Other studies reviewed: Additional studies/records reviewed today include: summarized above  ASSESSMENT AND PLAN:  1. SVT (wide complex beats  were observed Dr. Caryl Comes felt unlikely to be VT)     AFib was felt secondary to degeneration of her SVT     CHA2DS2Vasc is one for female, no a/c     On daily atenolol with very good control of her palpitations    She has not had need for PRN inderal       Disposition: We will see her annually, sooner if needed  Current medicines are reviewed at length with the patient today.  The patient did not have any concerns regarding medicines.  Haywood Lasso, PA-C 08/05/2018 6:36 AM     [address] [place] [address] [phone] (office)  [phone] (fax)

## 2018-08-05 NOTE — Telephone Encounter (Signed)
Patient called states she know Dr. Jenetta Downer did not originally prescribe:  clonazePAM (KLONOPIN) 1 MG tablet [[id number]]   Order Details  Dose: 0.5 mg Route: Oral Frequency: Daily for anxiety  Dispense Quantity: -- Refills: -- Fills remaining: --        Sig: Take 0.5 mg by mouth daily.           ----Patient is almost out of Rx & is trying to ween herself off, but still needs.  Please refill to:  [phone](Phone) [phone](Fax)   --Forwarding request to medical assistant for review w/provider.  -glh

## 2018-08-05 NOTE — Telephone Encounter (Signed)
Sent refill request to Dr. Raliegh Scarlet to review. MPulliam, CMA/RT(R)

## 2018-08-06 ENCOUNTER — Ambulatory Visit (INDEPENDENT_AMBULATORY_CARE_PROVIDER_SITE_OTHER): Payer: 59 | Admitting: Physician Assistant

## 2018-08-06 VITALS — BP 120/78 | HR 73 | Ht 69.0 in | Wt 183.0 lb

## 2018-08-06 DIAGNOSIS — I471 Supraventricular tachycardia: Secondary | ICD-10-CM

## 2018-08-06 NOTE — Patient Instructions (Signed)
Medication Instructions:   Your physician recommends that you continue on your current medications as directed. Please refer to the Current Medication list given to you today.   If you need a refill on your cardiac medications before your next appointment, please call your pharmacy.  Labwork: NONE ORDERED  TODAY    Testing/Procedures: NONE ORDERED  TODAY    Follow-Up: Your physician wants you to follow-up in: [place] will receive a reminder letter in the mail two months in advance. If you don't receive a letter, please call our office to schedule the follow-up appointment.     Any Other Special Instructions Will Be Listed Below (If Applicable).

## 2018-08-25 ENCOUNTER — Encounter: Payer: Self-pay | Admitting: Family Medicine

## 2018-08-25 ENCOUNTER — Ambulatory Visit (INDEPENDENT_AMBULATORY_CARE_PROVIDER_SITE_OTHER): Payer: 59 | Admitting: Family Medicine

## 2018-08-25 VITALS — BP 122/79 | HR 89 | Ht 69.0 in | Wt 191.3 lb

## 2018-08-25 DIAGNOSIS — F411 Generalized anxiety disorder: Secondary | ICD-10-CM | POA: Diagnosis not present

## 2018-08-25 DIAGNOSIS — I471 Supraventricular tachycardia: Secondary | ICD-10-CM

## 2018-08-25 DIAGNOSIS — F5104 Psychophysiologic insomnia: Secondary | ICD-10-CM

## 2018-08-25 DIAGNOSIS — Z23 Encounter for immunization: Secondary | ICD-10-CM | POA: Diagnosis not present

## 2018-08-25 NOTE — Progress Notes (Signed)
Impression and Recommendations:    1. GAD (generalized anxiety disorder)   2. Psychophysiological insomnia   3. h/o Paroxysmal SVT (supraventricular tachycardia) (HCC)   4. Flu vaccine need    1. Insomnia - Weaning off of Clonazepam - Per Pt, was unable to obtain refills on her clonazepam through clinic here (at a lower dosage) since she still had refills with prior provider.  Patient has been taking 0.25 mg daily by quartering tablets.  - Patient is sleeping well at this time. - Advised patient that she can discontinue Clonazepam at this time. - If she experiences side effects, she can take a quarter tablet for relief on that day.  - Advised patient that she may take a quarter tablet SPARINGLY for panic attack when nothing else assists her mood.  2. GAD- Zoloft Use - Symptoms controlled at this time on Zoloft.  See med list below. - Per Pt, reports she also uses CBD oil to assist with anxiety. - Patient tolerating meds well without complication.  Denies S-E  3. BMI Counseling Explained to patient what BMI refers to, and what it means medically.    Told patient to think about it as a "medical risk stratification measurement" and how increasing BMI is associated with increasing risk/ or worsening state of various diseases such as hypertension, hyperlipidemia, diabetes, premature OA, depression etc.  American Heart Association guidelines for healthy diet, basically Mediterranean diet, and exercise guidelines of 30 minutes 5 days per week or more discussed in detail.  Health counseling performed.  All questions answered.  4. Lifestyle & Preventative Health Maintenance - Advised patient to continue working toward exercising to improve overall mental, physical, and emotional health.    - Encouraged patient to engage in daily physical activity, especially a formal exercise routine.  Recommended that the patient eventually strive for at least 150 minutes of moderate cardiovascular  activity per week according to guidelines established by the [facility].   - Healthy dietary habits encouraged, including low-carb, and high amounts of lean protein in diet.   - Patient should also consume adequate amounts of water - half of body weight in oz of water per day.  5. Follow-Up - Prescriptions discontinued and refilled today as needed.  See med list today. - Re-check yearly fasting lab work as recommended.  - Continue to follow up with specialists as recommended, Cardiology for follow-up with h/o Paroxysmal SVT (supraventricular tachycardia). - Otherwise, continue to return for CPE and chronic follow-up as scheduled.   - Patient knows to call in if desired to address acute concerns.    Orders Placed This Encounter  Procedures  . Flu Vaccine QUAD 6+ mos PF IM (Fluarix Quad PF)    Medications Discontinued During This Encounter  Medication Reason  . clonazePAM (KLONOPIN) 1 MG tablet      Gross side effects, risk and benefits, and alternatives of medications and treatment plan in general discussed with patient.  Patient is aware that all medications have potential side effects and we are unable to predict every side effect or drug-drug interaction that may occur.   Patient will call with any questions prior to using medication if they have concerns.    Expresses verbal understanding and consents to current therapy and treatment regimen.  No barriers to understanding were identified.  Red flag symptoms and signs discussed in detail.  Patient expressed understanding regarding what to do in case of emergency\urgent symptoms  Please see AVS handed out to patient at  the end of our visit for further patient instructions/ counseling done pertaining to today's office visit.   Return for 6 months prior to lab work at work, come in for yearly physical with fasting blood work.     Note:  This note was prepared with assistance of Dragon voice recognition software. Occasional wrong-word or  sound-a-like substitutions may have occurred due to the inherent limitations of voice recognition software.   This document serves as a record of services personally performed by [name], DO. It was created on her behalf by Toni Amend, a trained medical scribe. The creation of this record is based on the scribe's personal observations and the provider's statements to them.   I have reviewed the above medical documentation for accuracy and completeness and I concur.  [name] 08/28/18 10:46 AM    ----------------------------------------------------------------------------------------------------------   Subjective:     HPI: Terry [name] is a 37 y.o. female who presents to [place] at [facility] today for issues as discussed below.  Sleep - Weaning off Clonazepam Patient states that she is now down to a quarter of a tablet. She wishes to completely discontinue at this time.  Notes that her sleep has been fine.  Patient states she thinks that her sleep issue was more mental than medical; remarks "I think it was all in my head."  She feels that she has more energy and feels better now.  She tried the CBD oil and likes it.  Notes "it doesn't make me feel drugged."  No issues falling asleep or staying asleep. Denies nausea, belly pain, agitation, sweating, chest pain, SOB, etc.  Patient was unable to obtain refills on her clonazepam through clinic here (lower dosage) since she still had refills with prior provider.  Anxiety - Continuing Zoloft Patient feels fine on her current prescription of Zoloft.  Patient feels that her anxiety is also controlled with CBD oil PRN.  Patient has been able to relax better.  Feels that it "takes all the clutter away" and helps her focus on things.  Exercise & Weight Loss She is starting Weight [employer] tomorrow.  Patient bought an elliptical but has only used it twice.  Cardiology - Dr. Caryl Comes Patient  went for follow up and all was well. Patient continues on atenolol as prescribed by Cards.   Wt Readings from Last 3 Encounters:  08/25/18 191 lb 4.8 oz (86.8 kg)  08/06/18 183 lb (83 kg)  07/14/18 183 lb (83 kg)   BP Readings from Last 3 Encounters:  08/25/18 122/79  08/06/18 120/78  07/14/18 130/79   Pulse Readings from Last 3 Encounters:  08/25/18 89  08/06/18 73  07/14/18 88   BMI Readings from Last 3 Encounters:  08/25/18 28.25 kg/m  08/06/18 27.02 kg/m  07/14/18 27.02 kg/m     Patient Care Team    Relationship Specialty Notifications Start End  [name], DO PCP - General Family Medicine  07/14/18   Deboraha Sprang, MD Consulting Physician Cardiology  07/14/18      Patient Active Problem List   Diagnosis Date Noted  . IBS (irritable bowel syndrome) 07/14/2018  . GAD (generalized anxiety disorder) 07/14/2018  . Celiac disease 07/14/2018  . Gluten intolerance 07/14/2018  . Psychophysiological insomnia 07/14/2018  . h/o Paroxysmal SVT (supraventricular tachycardia) ([place]) 07/14/2018  . Rapid heart rate 03/30/2017  . Dysautonomia-orthostatic intolerance 04/08/2011  . Sinus tachycardia 04/08/2011  . Pregnancy, third trimester 04/08/2011    Past Medical history, Surgical  history, Family history, Social history, Allergies and Medications have been entered into the medical record, reviewed and changed as needed.    Current Meds  Medication Sig  . atenolol (TENORMIN) 25 MG tablet Take 1 tablet (25 mg total) by mouth daily.  Marland Kitchen loratadine (CLARITIN) 10 MG tablet Take 10 mg by mouth daily as needed for allergies.  . Multiple Vitamin (MULTIVITAMIN) tablet Take 1 tablet by mouth daily.  . sertraline (ZOLOFT) 100 MG tablet Take 1 tablet (100 mg total) by mouth daily.  . [DISCONTINUED] clonazePAM (KLONOPIN) 1 MG tablet Take 0.25 mg by mouth daily.     Allergies:  Allergies  Allergen Reactions  . Wheat Bran Other (See Comments)    All gluten     Review  of Systems:  A fourteen system review of systems was performed and found to be positive as per HPI.   Objective:   Blood pressure 122/79, pulse 89, height 5' 9"  (1.753 m), weight 191 lb 4.8 oz (86.8 kg), SpO2 100 %. Body mass index is 28.25 kg/m. General:  Well Developed, well nourished, appropriate for stated age.  Neuro:  Alert and oriented,  extra-ocular muscles intact  HEENT:  Normocephalic, atraumatic, neck supple, no carotid bruits appreciated  Skin:  no gross rash, warm, pink. Cardiac:  RRR, S1 S2 Respiratory:  ECTA B/L and A/P, Not using accessory muscles, speaking in full sentences- unlabored. Vascular:  Ext warm, no cyanosis apprec.; cap RF less 2 sec. Psych:  No HI/SI, judgement and insight good, Euthymic mood. Full Affect.

## 2018-08-25 NOTE — Patient Instructions (Signed)
Should be able to completely come off of the clonazepam now.  If for whatever reason you do have some symptoms, you can take 1/4 tablet every other day or every third day for a couple weeks then off.  Great job.  So glad you have improvement in life and certainly longevity and quality of life is going to improve because of it.

## 2018-09-16 ENCOUNTER — Encounter: Payer: Self-pay | Admitting: Family Medicine

## 2018-11-12 ENCOUNTER — Encounter: Payer: Self-pay | Admitting: Family Medicine

## 2018-11-12 ENCOUNTER — Ambulatory Visit (INDEPENDENT_AMBULATORY_CARE_PROVIDER_SITE_OTHER): Payer: 59 | Admitting: Family Medicine

## 2018-11-12 VITALS — BP 121/81 | HR 80 | Temp 98.6°F | Ht 69.0 in | Wt 178.6 lb

## 2018-11-12 DIAGNOSIS — J329 Chronic sinusitis, unspecified: Secondary | ICD-10-CM

## 2018-11-12 DIAGNOSIS — J069 Acute upper respiratory infection, unspecified: Secondary | ICD-10-CM

## 2018-11-12 DIAGNOSIS — R51 Headache: Secondary | ICD-10-CM | POA: Diagnosis not present

## 2018-11-12 DIAGNOSIS — J310 Chronic rhinitis: Secondary | ICD-10-CM

## 2018-11-12 DIAGNOSIS — R519 Headache, unspecified: Secondary | ICD-10-CM

## 2018-11-12 DIAGNOSIS — J0130 Acute sphenoidal sinusitis, unspecified: Secondary | ICD-10-CM

## 2018-11-12 MED ORDER — AMOXICILLIN 500 MG PO CAPS: 1000.0000 mg | ORAL_CAPSULE | Freq: Two times a day (BID) | ORAL | 0 refills | Status: DC

## 2018-11-12 NOTE — Patient Instructions (Signed)
Sinusitis, Adult Sinusitis is inflammation of your sinuses. Sinuses are hollow spaces in the bones around your face. Your sinuses are located:  Around your eyes.  In the middle of your forehead.  Behind your nose.  In your cheekbones. Mucus normally drains out of your sinuses. When your nasal tissues become inflamed or swollen, mucus can become trapped or blocked. This allows bacteria, viruses, and fungi to grow, which leads to infection. Most infections of the sinuses are caused by a virus. Sinusitis can develop quickly. It can last for up to 4 weeks (acute) or for more than 12 weeks (chronic). Sinusitis often develops after a cold. What are the causes? This condition is caused by anything that creates swelling in the sinuses or stops mucus from draining. This includes:  Allergies.  Asthma.  Infection from bacteria or viruses.  Deformities or blockages in your nose or sinuses.  Abnormal growths in the nose (nasal polyps).  Pollutants, such as chemicals or irritants in the air.  Infection from fungi (rare). What increases the risk? You are more likely to develop this condition if you:  Have a weak body defense system (immune system).  Do a lot of swimming or diving.  Overuse nasal sprays.  Smoke. What are the signs or symptoms? The main symptoms of this condition are pain and a feeling of pressure around the affected sinuses. Other symptoms include:  Stuffy nose or congestion.  Thick drainage from your nose.  Swelling and warmth over the affected sinuses.  Headache.  Upper toothache.  A cough that may get worse at night.  Extra mucus that collects in the throat or the back of the nose (postnasal drip).  Decreased sense of smell and taste.  Fatigue.  A fever.  Sore throat.  Bad breath. How is this diagnosed? This condition is diagnosed based on:  Your symptoms.  Your medical history.  A physical exam.  Tests to find out if your condition is  acute or chronic. This may include: ? Checking your nose for nasal polyps. ? Viewing your sinuses using a device that has a light (endoscope). ? Testing for allergies or bacteria. ? Imaging tests, such as an MRI or CT scan. In rare cases, a bone biopsy may be done to rule out more serious types of fungal sinus disease. How is this treated? Treatment for sinusitis depends on the cause and whether your condition is chronic or acute.  If caused by a virus, your symptoms should go away on their own within 10 days. You may be given medicines to relieve symptoms. They include: ? Medicines that shrink swollen nasal passages (topical intranasal decongestants). ? Medicines that treat allergies (antihistamines). ? A spray that eases inflammation of the nostrils (topical intranasal corticosteroids). ? Rinses that help get rid of thick mucus in your nose (nasal saline washes).  If caused by bacteria, your health care provider may recommend waiting to see if your symptoms improve. Most bacterial infections will get better without antibiotic medicine. You may be given antibiotics if you have: ? A severe infection. ? A weak immune system.  If caused by narrow nasal passages or nasal polyps, you may need to have surgery. Follow these instructions at home: Medicines  Take, use, or apply over-the-counter and prescription medicines only as told by your health care provider. These may include nasal sprays.  If you were prescribed an antibiotic medicine, take it as told by your health care provider. Do not stop taking the antibiotic even if you start  to feel better. Hydrate and humidify   Drink enough fluid to keep your urine pale yellow. Staying hydrated will help to thin your mucus.  Use a cool mist humidifier to keep the humidity level in your home above 50%.  Inhale steam for 10-15 minutes, 3-4 times a day, or as told by your health care provider. You can do this in the bathroom while a hot shower is  running.  Limit your exposure to cool or dry air. Rest  Rest as much as possible.  Sleep with your head raised (elevated).  Make sure you get enough sleep each night. General instructions   Apply a warm, moist washcloth to your face 3-4 times a day or as told by your health care provider. This will help with discomfort.  Wash your hands often with soap and water to reduce your exposure to germs. If soap and water are not available, use hand sanitizer.  Do not smoke. Avoid being around people who are smoking (secondhand smoke).  Keep all follow-up visits as told by your health care provider. This is important. Contact a health care provider if:  You have a fever.  Your symptoms get worse.  Your symptoms do not improve within 10 days. Get help right away if:  You have a severe headache.  You have persistent vomiting.  You have severe pain or swelling around your face or eyes.  You have vision problems.  You develop confusion.  Your neck is stiff.  You have trouble breathing. Summary  Sinusitis is soreness and inflammation of your sinuses. Sinuses are hollow spaces in the bones around your face.  This condition is caused by nasal tissues that become inflamed or swollen. The swelling traps or blocks the flow of mucus. This allows bacteria, viruses, and fungi to grow, which leads to infection.  If you were prescribed an antibiotic medicine, take it as told by your health care provider. Do not stop taking the antibiotic even if you start to feel better.  Keep all follow-up visits as told by your health care provider. This is important. This information is not intended to replace advice given to you by your health care provider. Make sure you discuss any questions you have with your health care provider. Document Released: 11/04/2005 Document Revised: 04/06/2018 Document Reviewed: 04/06/2018 Elsevier Interactive Patient Education  [employer].    The below is  just an FYI in case you develop a red, inflamed, irritated conjunctival membrane.   Viral Conjunctivitis, Adult  Viral conjunctivitis is an inflammation of the clear membrane that covers the white part of your eye and the inner surface of your eyelid (conjunctiva). The inflammation is caused by a viral infection. The blood vessels in the conjunctiva become inflamed, causing the eye to become red or pink, and often itchy. Viral conjunctivitis can be easily passed from one person to another (is contagious). This condition is often called pink eye. What are the causes? This condition is caused by a virus. A virus is a type of contagious germ. It can be spread by touching objects that have been contaminated with the virus, such as doorknobs or towels. It can also be passed through droplets, such as from coughing or sneezing. What are the signs or symptoms? Symptoms of this condition include:  Eye redness.  Tearing or watery eyes.  Itchy and irritated eyes.  Burning feeling in the eyes.  Clear drainage from the eye.  Swollen eyelids.  A gritty feeling in the eye.  Light sensitivity. This condition often occurs with other symptoms, such as a fever, nausea, or a rash. How is this diagnosed? This condition is diagnosed with a medical history and physical exam. If you have discharge from your eye, the discharge may be tested to rule out other causes of conjunctivitis. How is this treated? Viral conjunctivitis does not respond to medicines that kill bacteria (antibiotics). Treatment for viral conjunctivitis is directed at stopping a bacterial infection from developing in addition to the viral infection. Treatment also aims to relieve your symptoms, such as itching. This may be done with antihistamine drops or other eye medicines. Rarely, steroid eye drops or antiviral medicines may be prescribed. Follow these instructions at home: Medicines   Take or apply over-the-counter and prescription  medicines only as told by your health care provider.  Be very careful to avoid touching the edge of the eyelid with the eye drop bottle or ointment tube when applying medicines to the affected eye. Being careful this way will stop you from spreading the infection to the other eye or to other people. Eye care  Avoid touching or rubbing your eyes.  Apply a warm, wet, clean washcloth to your eye for 10-20 minutes, 3-4 times per day or as told by your health care provider.  If you wear contact lenses, do not wear them until the inflammation is gone and your health care provider says it is safe to wear them again. Ask your health care provider how to sterilize or replace your contact lenses before using them again. Wear glasses until you can resume wearing contacts.  Avoid wearing eye makeup until the inflammation is gone. Throw away any old eye cosmetics that may be contaminated.  Gently wipe away any drainage from your eye with a warm, wet washcloth or a cotton ball. General instructions  Change or wash your pillowcase every day or as told by your health care provider.  Do not share towels, pillowcases, washcloths, eye makeup, makeup brushes, contact lenses, or glasses. This may spread the infection.  Wash your hands often with soap and water. Use paper towels to dry your hands. If soap and water are not available, use hand sanitizer.  Try to avoid contact with other people for one week or as told by your health care provider. Contact a health care provider if:  Your symptoms do not improve with treatment or they get worse.  You have increased pain.  Your vision becomes blurry.  You have a fever.  You have facial pain, redness, or swelling.  You have yellow or green drainage coming from your eye.  You have new symptoms. This information is not intended to replace advice given to you by your health care provider. Make sure you discuss any questions you have with your health care  provider. Document Released: 01/25/2003 Document Revised: 06/01/2016 Document Reviewed: 05/21/2016 Elsevier Interactive Patient Education  2019 [employer].

## 2018-11-12 NOTE — Progress Notes (Signed)
Acute Care Office visit   Assessment and plan:  1. Rhinosinusitis   2. Face pain   3. Upper respiratory tract infection, unspecified type   4. Acute non-recurrent sphenoidal sinusitis     - Viral vs Allergic vs Bacterial causes for pt's symptoms reveiwed.     - Given patient's ongoing symptoms for the past week, worsening, antibiotics prescribed today.  - Patient declines steroids today.  Per patient, notes in the past that she had a steroid injection in the past which caused her to have terrible nightmares, stress, etc.  - Supportive care and various OTC medications discussed in addition to any prescribed.  - Continue use of Tessalon Perles for cough relief.  Patient may also use Mucinex for relief.  - Advised use of warm compress over her eye for relief her eye swelling/redness if desired.  - Call or RTC if new symptoms, or if no improvement or worse over next several days.     Meds ordered this encounter  Medications  . amoxicillin (AMOXIL) 500 MG capsule    Sig: Take 2 capsules (1,000 mg total) by mouth 2 (two) times daily.    Dispense:  40 capsule    Refill:  0    Gross side effects, risk and benefits, and alternatives of medications discussed with patient.  Patient is aware that all medications have potential side effects and we are unable to predict every sideeffect or drug-drug interaction that may occur.  Expresses verbal understanding and consents to current therapy plan and treatment regiment.   Education and routine counseling performed. Handouts provided.  Anticipatory guidance and routine counseling done re: condition, txmnt options and need for follow up. All questions of patient's were answered.  Return if symptoms worsen or fail to improve, for F-up of current med issues as previously d/c pt.  Please see AVS handed out to patient at the end of our visit for additional patient instructions/ counseling done pertaining to today's office visit.  Note:   This document was partially repared using Dragon voice recognition software and may include unintentional dictation errors.  This document serves as a record of services personally performed by [name], DO. It was created on her behalf by Toni Amend, a trained medical scribe. The creation of this record is based on the scribe's personal observations and the provider's statements to them.   I have reviewed the above medical documentation for accuracy and completeness and I concur.  [name], DO 11/15/2018 9:03 PM    Subjective:    Chief Complaint  Patient presents with  . Sinus Problem    HPI:  Pt presents with Sx for 6-7 days.  Started with scratchy throat, runny nose, "draining like crazy."  Noticed her eye or sinus was hurting.  Now having symptoms including a red/itchy eye.  States that everyone at work has been sick with a "really bad head cold."  Notes she usually doesn't go to the doctor, aside from her "pinkeye" symptoms.   C/o:  Has some pain and pressure on the left periorbital reason, for the past 3 days.    For symptoms patient has tried:  Ibuprofen, with some relief.  Uses Tessalon Perles to help with cough.  Overall getting:  Feeling worse.    Patient Care Team    Relationship Specialty Notifications Start End  [name], DO PCP - General Family Medicine  07/14/18   Deboraha Sprang, MD Consulting Physician Cardiology  07/14/18  Past medical history, Surgical history, Family history reviewed and noted below, Social history, Allergies, and Medications have been entered into the medical record, reviewed and changed as needed.   Allergies  Allergen Reactions  . Wheat Bran Other (See Comments)    All gluten    Review of Systems: - see above HPI for pertinent positives General:   No F/C, wt loss Pulm:   No DIB, pleuritic chest pain Card:  No CP, palpitations Abd:  No n/v/d or pain Ext:  No inc edema from  baseline   Objective:   Blood pressure 121/81, pulse 80, temperature 98.6 F (37 C), height 5' 9"  (1.753 m), weight 178 lb 9.6 oz (81 kg), last menstrual period 10/19/2018, SpO2 98 %. Body mass index is 26.37 kg/m. General: Well Developed, well nourished, appropriate for stated age.  Neuro: Alert and oriented x3, extra-ocular muscles intact, sensation grossly intact.  HEENT: Normocephalic, atraumatic, pupils equal round reactive to light, neck supple, left anterior cervical enlarged lymph nodes that are non-tender, TM's intact B/L, no acute findings. Nares- patent, clear d/c, OP- clear, mild erythema, No TTP sinuses Skin: Warm and dry, no gross rash. Cardiac: RRR, S1 S2,  no murmurs rubs or gallops.  Respiratory: ECTA B/L and A/P, Not using accessory muscles, speaking in full sentences- unlabored. Vascular:  No gross lower ext edema, cap RF less 2 sec. Psych: No HI/SI, judgement and insight good, Euthymic mood. Full Affect.

## 2018-11-17 ENCOUNTER — Other Ambulatory Visit: Payer: Self-pay | Admitting: Internal Medicine

## 2018-11-19 ENCOUNTER — Encounter: Payer: Self-pay | Admitting: Family Medicine

## 2018-11-19 MED ORDER — ATENOLOL 25 MG PO TABS: 25.0000 mg | ORAL_TABLET | Freq: Every day | ORAL | 2 refills | Status: DC

## 2018-11-19 NOTE — Telephone Encounter (Signed)
Pt's medication was sent to pt's pharmacy as requested. Confirmation received.  °

## 2018-11-20 ENCOUNTER — Other Ambulatory Visit: Payer: Self-pay

## 2018-11-20 DIAGNOSIS — F411 Generalized anxiety disorder: Secondary | ICD-10-CM

## 2018-11-20 MED ORDER — SERTRALINE HCL 100 MG PO TABS: 100.0000 mg | ORAL_TABLET | Freq: Every day | ORAL | 1 refills | Status: DC

## 2019-01-29 ENCOUNTER — Encounter: Payer: Self-pay | Admitting: Family Medicine

## 2019-01-29 ENCOUNTER — Other Ambulatory Visit: Payer: Self-pay

## 2019-01-29 DIAGNOSIS — F411 Generalized anxiety disorder: Secondary | ICD-10-CM

## 2019-01-29 MED ORDER — CLONAZEPAM 1 MG PO TABS: 1.0000 mg | ORAL_TABLET | Freq: Every day | ORAL | 0 refills | Status: DC

## 2019-01-29 NOTE — Progress Notes (Signed)
Script print per Dr. Raliegh Scarlet.  Controlled substance database reviewed. MPulliam, CMA/RT(R)

## 2019-03-15 ENCOUNTER — Other Ambulatory Visit: Payer: Self-pay

## 2019-03-15 ENCOUNTER — Ambulatory Visit (INDEPENDENT_AMBULATORY_CARE_PROVIDER_SITE_OTHER): Payer: BLUE CROSS/BLUE SHIELD | Admitting: Family Medicine

## 2019-03-15 ENCOUNTER — Encounter: Payer: Self-pay | Admitting: Family Medicine

## 2019-03-15 VITALS — HR 81 | Temp 97.8°F | Ht 69.0 in | Wt 186.0 lb

## 2019-03-15 DIAGNOSIS — Z8639 Personal history of other endocrine, nutritional and metabolic disease: Secondary | ICD-10-CM

## 2019-03-15 DIAGNOSIS — F40232 Fear of other medical care: Secondary | ICD-10-CM

## 2019-03-15 DIAGNOSIS — Z719 Counseling, unspecified: Secondary | ICD-10-CM

## 2019-03-15 DIAGNOSIS — E781 Pure hyperglyceridemia: Secondary | ICD-10-CM | POA: Insufficient documentation

## 2019-03-15 DIAGNOSIS — F5104 Psychophysiologic insomnia: Secondary | ICD-10-CM

## 2019-03-15 DIAGNOSIS — F410 Panic disorder [episodic paroxysmal anxiety] without agoraphobia: Secondary | ICD-10-CM | POA: Diagnosis not present

## 2019-03-15 DIAGNOSIS — Z8349 Family history of other endocrine, nutritional and metabolic diseases: Secondary | ICD-10-CM

## 2019-03-15 DIAGNOSIS — Z79899 Other long term (current) drug therapy: Secondary | ICD-10-CM | POA: Insufficient documentation

## 2019-03-15 DIAGNOSIS — F411 Generalized anxiety disorder: Secondary | ICD-10-CM

## 2019-03-15 DIAGNOSIS — E782 Mixed hyperlipidemia: Secondary | ICD-10-CM | POA: Insufficient documentation

## 2019-03-15 DIAGNOSIS — E785 Hyperlipidemia, unspecified: Secondary | ICD-10-CM | POA: Insufficient documentation

## 2019-03-15 MED ORDER — CLONAZEPAM 1 MG PO TABS: 1.0000 mg | ORAL_TABLET | Freq: Every day | ORAL | 0 refills | Status: DC

## 2019-03-15 MED ORDER — BUSPIRONE HCL 10 MG PO TABS: 10.0000 mg | ORAL_TABLET | Freq: Three times a day (TID) | ORAL | 1 refills | Status: DC

## 2019-03-15 NOTE — Progress Notes (Signed)
Virtual / live video office visit note for [employer], D.O- Primary Care Physician at [facility]   I connected with current patient today and beyond visually recognizing the correct individual, I verified that I am speaking with the correct person using two identifiers.  . Location of the patient: Home . Location of the provider: Office Only the patient (+/- their family members at pt's discretion) and myself were participating in the encounter    - This visit type was conducted due to national recommendations for restrictions regarding the COVID-19 Pandemic (e.g. social distancing) in an effort to limit this patient's exposure and mitigate transmission in our community.  This format is felt to be most appropriate for this patient at this time.   - The patient did have access to video technology today yet just towards end of OV, we had some breaking up of the sound on video/ technical difficulties with this method, requiring transitioning to audio only.    - No physical exam could be performed with this format, beyond that communicated to [place] by the patient/ family members as noted.   - Additionally my office staff/ schedulers discussed with the patient that there may be a monetary charge related to this service, depending on patient's medical insurance.   The patient expressed understanding, and agreed to proceed.      History of Present Illness:  Patient's generalized anxiety has gotten worse over the course of the past 2 months since the whole Covid-19 outbreak started.  Back on 01/29/2019 we prescribed patient clonazepam that she was on prior.  The last time she was prescribed this was 06/08/2018 from her prior physician.  When she became a patient of ours we transitioned her onto Zoloft and her anxiety up until now has been very well controlled.  However with the covid-19, working from home, taking care of 3 children, and the stress of life etc., her symptoms have gotten much worse.     She worries about everything and around 4:56 PM every evening she feels like she is having a "out of body experience of a panic attack ".   She finds it difficult to breathe, even think straight during these times.  The only thing that seems to calm her down and make her feel seminormal is a half of the clonazepam.  She is actually able to care for her children and family after she takes this otherwise, she is unable to.  Patient is also drinking occasionally 1 to 2 glasses daily but nothing that she is concerned about.   She feels very isolated.Only noting that she goes for [employer] pickup of groceries and has been out for a walk one time in the past 7 weeks.  She does see her family members however-her mother that occasionally comes over but otherwise she does not see anybody.  She is trying to do her meditation that we have discussed in the past which is been not so helpful lately.  She denies any suicidal or homicidal ideations.  Also she is not sleeping that great but still it is much better than when I first met her.  She has concerns today that she does not want to go up on the Zoloft and she is worried about medication side effect and has several, several questions for me today.  She also has several questions and concerns about Covid-19, testing, course of disease etc.    Impression and Recommendations:    1. GAD (generalized  anxiety disorder) Long discussion regarding medications, risks of them, long-term side effects, need for weaning off them etc. etc.  Patient had multiple questions today. - clonazePAM (KLONOPIN) 1 MG tablet; Take 1 tablet (1 mg total) by mouth daily.  Dispense: 30 tablet; Refill: 0 - busPIRone (BUSPAR) 10 MG tablet; Take 1 tablet (10 mg total) by mouth 3 (three) times daily.  Dispense: 270 tablet; Refill: 1   2. Panic disorder Long discussion regarding medications, risks of them, long-term side effects, need for weaning off them etc. etc.  Patient had multiple questions  today. - clonazePAM (KLONOPIN) 1 MG tablet; Take 1 tablet (1 mg total) by mouth daily.  Dispense: 30 tablet; Refill: 0 - busPIRone (BUSPAR) 10 MG tablet; Take 1 tablet (10 mg total) by mouth 3 (three) times daily.  Dispense: 270 tablet; Refill: 1   3. Medication course changed - Counseled patient on pathophysiology of disease and discussed various treatment options, which often includes dietary and lifestyle modifications as first line, in addition to discussing the risks and benefits of various medications.   - Importance of healthy eating, getting adequate sleep, daily exercise and seeking the help of a professional counselor discussed.  Pt encouraged to call one for an appt for counseling.  She declined referral today.  - Meditation and relaxation techniques discussed with patient.   Deep breathing/ Square breathing exercises reviewed.   - Encouraged daily meditation and regular exercise  - Anticipatory guidance given and pt was encouraged to return to clinic or call the office with any further questions or concerns.  4. Psychophysiological insomnia We discussed sleep hygiene, sleep meditation on YouTube or free apps such as, etc.  5. Fear associated with healthcare - Novel Covid -19 counseling done; all questions were answered.   - Current CDC and federal guidelines reviewed with patient  - Reminded pt of extreme importance of social distancing; minimizing contacts with others, avoiding ALL but emergency appts etc. - Told patient to be prepared, not scared; and be smart for the sake of others - told to call with any concerns   6. Health education/counseling Extensively about COVID, stress management, insomnia, medication side effect, etc. etc.  7. History of hyperlipidemia, mixed Patient's last blood work was 3 of 2019.  This was done through her work.  She needs a full set again. -Of note at that time her CMP, CBC, TSH, A1c was all completely normal.  She did have hyperlipidemia  and hypertriglyceridemia 8. History of endogenous hypertriglyceridemia  9. FHx: early menopause-patient fears she is going through this right now. Patient's mother did go through menopause around 81.  We did discuss this may be a possibility since she is having night sweats.  We also discussed potential side effects of medicines can either potentiate or improve those night sweats.  - As part of my medical decision making, I reviewed the following data within the [place] History obtained from pt /family, CMA notes reviewed and incorporated if applicable, Labs reviewed, Radiograph/ tests reviewed if applicable and OV notes from prior OV's with me, as well as other specialists she/he has seen since seeing me last, were all reviewed and used in my medical decision making process today.   - Additionally, discussion had with patient regarding txmnt plan, their biases about that plan etc were used in my medical decision making today.   - The patient agreed with the plan and demonstrated an understanding of the instructions.   No barriers to understanding were  identified.   - Red flag symptoms and signs discussed in detail.  Patient expressed understanding regarding what to do in case of emergency\ urgent symptoms.  The patient was advised to call back or seek an in-person evaluation if the symptoms worsen or if the condition fails to improve as anticipated.   Return for 10 wks- started buspar; patient needs a full set FBW but not prior to next OV due to fears.    Meds ordered this encounter  Medications  . clonazePAM (KLONOPIN) 1 MG tablet    Sig: Take 1 tablet (1 mg total) by mouth daily.    Dispense:  30 tablet    Refill:  0  . busPIRone (BUSPAR) 10 MG tablet    Sig: Take 1 tablet (10 mg total) by mouth 3 (three) times daily.    Dispense:  270 tablet    Refill:  1    I provided 45+ minutes of non-face-to-face time during this encounter,with over 50% of the time in direct  counseling on patients medical conditions/ medical concerns.  Additional time was spent with charting and coordination of care after the actual visit commenced.   Note:  This note was prepared with assistance of Dragon voice recognition software. Occasional wrong-word or sound-a-like substitutions may have occurred due to the inherent limitations of voice recognition software.  [name], DO    Depression screen [facility] 2/9 03/15/2019 11/12/2018 08/25/2018 07/14/2018  Decreased Interest 0 0 0 0  Down, Depressed, Hopeless 0 0 0 0  PHQ - 2 Score 0 0 0 0  Altered sleeping 1 0 0 0  Tired, decreased energy 0 0 0 0  Change in appetite 0 0 0 0  Feeling bad or failure about yourself  0 0 0 0  Trouble concentrating 0 0 0 0  Moving slowly or fidgety/restless 1 0 0 0  Suicidal thoughts 0 0 0 0  PHQ-9 Score 2 0 0 0  Difficult doing work/chores - Not difficult at all Not difficult at all Not difficult at all       GAD 7 : Generalized Anxiety Score 03/15/2019 08/25/2018  Nervous, Anxious, on Edge 1 0  Control/stop worrying 1 0  Worry too much - different things 1 0  Trouble relaxing 3 0  Restless 1 0  Easily annoyed or irritable 0 0  Afraid - awful might happen 1 0  Total GAD 7 Score 8 0  Anxiety Difficulty Somewhat difficult Not difficult at all      Patient Care Team    Relationship Specialty Notifications Start End  [name], DO PCP - General Family Medicine  07/14/18   Deboraha Sprang, MD Consulting Physician Cardiology  07/14/18     -Vitals obtained; medications/ allergies reconciled;  personal medical, social, Sx etc.histories were updated by CMA, reviewed by me and are reflected in chart   Patient Active Problem List   Diagnosis Date Noted  . Panic disorder 03/15/2019    Priority: High  . History of hyperlipidemia, mixed 03/15/2019    Priority: High  . GAD (generalized anxiety disorder) 07/14/2018    Priority: High  . Psychophysiological insomnia 07/14/2018     Priority: High  . Rapid heart rate 03/30/2017    Priority: Medium  . Dysautonomia-orthostatic intolerance 04/08/2011    Priority: Medium  . Sinus tachycardia 04/08/2011    Priority: Medium  . IBS (irritable bowel syndrome) 07/14/2018    Priority: Low  . Celiac disease 07/14/2018    Priority: Low  .  Gluten intolerance 07/14/2018    Priority: Low  . Medication course changed 03/15/2019  . History of endogenous hypertriglyceridemia 03/15/2019  . h/o Paroxysmal SVT (supraventricular tachycardia) ([place]) 07/14/2018  . Pregnancy, third trimester 04/08/2011     Current Meds  Medication Sig  . atenolol (TENORMIN) 25 MG tablet Take 1 tablet (25 mg total) by mouth daily.  . clonazePAM (KLONOPIN) 1 MG tablet Take 1 tablet (1 mg total) by mouth daily.  Marland Kitchen loratadine (CLARITIN) 10 MG tablet Take 10 mg by mouth daily as needed for allergies.  . Multiple Vitamin (MULTIVITAMIN) tablet Take 1 tablet by mouth daily.  . sertraline (ZOLOFT) 100 MG tablet Take 1 tablet (100 mg total) by mouth daily.  . [DISCONTINUED] clonazePAM (KLONOPIN) 1 MG tablet Take 1 tablet (1 mg total) by mouth daily.     Allergies  Allergen Reactions  . Wheat Bran Other (See Comments)    All gluten     ROS:  See above HPI for pertinent positives and negatives   Objective:   Pulse 81, temperature 97.8 F (36.6 C), height _0  (1.753 m), weight 186 lb (84.4 kg), last menstrual period 03/13/2019.  (if some vitals are omitted, this means that patient was UNABLE to obtain them even though they were asked to get them prior to [place] today.  They were asked to call us at their earliest convenience with these once obtained.)  General: A & O * 3; visually in no acute distress; in usual state of health.  Skin: Visible skin appears normal and pt's usual skin color HEENT:  EOMI, head is normocephalic and atraumatic.  Sclera are anicteric. Neck has a good range of motion.  Lips are noncyanotic Chest: normal chest excursion and  movement Respiratory: speaking in full sentences, no conversational dyspnea; no use of accessory muscles Psych: insight good, mood- appears full

## 2019-05-31 ENCOUNTER — Other Ambulatory Visit: Payer: Self-pay

## 2019-05-31 DIAGNOSIS — F411 Generalized anxiety disorder: Secondary | ICD-10-CM

## 2019-05-31 MED ORDER — SERTRALINE HCL 100 MG PO TABS: 100.0000 mg | ORAL_TABLET | Freq: Every day | ORAL | 0 refills | Status: DC

## 2019-05-31 NOTE — Telephone Encounter (Signed)
Pharmacy sent refill request foe sertraline 100 mg - patient is due for f/u - per office policy sent in 30 day supply and patient to be contacted to set up appointment. MPulliam, CMA/RT(R)

## 2019-06-14 ENCOUNTER — Encounter: Payer: Self-pay | Admitting: Family Medicine

## 2019-06-22 ENCOUNTER — Other Ambulatory Visit: Payer: BC Managed Care – PPO

## 2019-06-22 ENCOUNTER — Other Ambulatory Visit: Payer: Self-pay

## 2019-06-22 DIAGNOSIS — Z0000 Encounter for general adult medical examination without abnormal findings: Secondary | ICD-10-CM | POA: Diagnosis not present

## 2019-06-22 DIAGNOSIS — Z8639 Personal history of other endocrine, nutritional and metabolic disease: Secondary | ICD-10-CM

## 2019-06-22 DIAGNOSIS — R000 Tachycardia, unspecified: Secondary | ICD-10-CM | POA: Diagnosis not present

## 2019-06-23 ENCOUNTER — Other Ambulatory Visit: Payer: Self-pay

## 2019-06-23 DIAGNOSIS — F411 Generalized anxiety disorder: Secondary | ICD-10-CM

## 2019-06-23 LAB — HEMOGLOBIN A1C
Est. average glucose Bld gHb Est-mCnc: 94 mg/dL
Hgb A1c MFr Bld: 4.9 % (ref 4.8–5.6)

## 2019-06-23 LAB — VITAMIN D 25 HYDROXY (VIT D DEFICIENCY, FRACTURES): Vit D, 25-Hydroxy: 21 ng/mL — ABNORMAL LOW (ref 30.0–100.0)

## 2019-06-23 LAB — CBC WITH DIFFERENTIAL/PLATELET
Basophils Absolute: 0 10*3/uL (ref 0.0–0.2)
Basos: 1 %
EOS (ABSOLUTE): 0.1 10*3/uL (ref 0.0–0.4)
Eos: 2 %
Hematocrit: 41.5 % (ref 34.0–46.6)
Hemoglobin: 13.9 g/dL (ref 11.1–15.9)
Immature Grans (Abs): 0 10*3/uL (ref 0.0–0.1)
Immature Granulocytes: 0 %
Lymphocytes Absolute: 1.6 10*3/uL (ref 0.7–3.1)
Lymphs: 29 %
MCH: 30.5 pg (ref 26.6–33.0)
MCHC: 33.5 g/dL (ref 31.5–35.7)
MCV: 91 fL (ref 79–97)
Monocytes Absolute: 0.6 10*3/uL (ref 0.1–0.9)
Monocytes: 10 %
Neutrophils Absolute: 3.4 10*3/uL (ref 1.4–7.0)
Neutrophils: 58 %
Platelets: 282 10*3/uL (ref 150–450)
RBC: 4.55 x10E6/uL (ref 3.77–5.28)
RDW: 12.3 % (ref 11.7–15.4)
WBC: 5.8 10*3/uL (ref 3.4–10.8)

## 2019-06-23 LAB — COMPREHENSIVE METABOLIC PANEL
ALT: 14 IU/L (ref 0–32)
AST: 17 IU/L (ref 0–40)
Albumin/Globulin Ratio: 1.7 (ref 1.2–2.2)
Albumin: 4.2 g/dL (ref 3.8–4.8)
Alkaline Phosphatase: 75 IU/L (ref 39–117)
BUN/Creatinine Ratio: 19 (ref 9–23)
BUN: 15 mg/dL (ref 6–24)
Bilirubin Total: <0.2 mg/dL (ref 0.0–1.2)
CO2: 23 mmol/L (ref 20–29)
Calcium: 9.5 mg/dL (ref 8.7–10.2)
Chloride: 99 mmol/L (ref 96–106)
Creatinine, Ser: 0.81 mg/dL (ref 0.57–1.00)
GFR calc Af Amer: 105 mL/min/{1.73_m2} (ref >59)
GFR calc non Af Amer: 91 mL/min/{1.73_m2} (ref >59)
Globulin, Total: 2.5 g/dL (ref 1.5–4.5)
Glucose: 85 mg/dL (ref 65–99)
Potassium: 4.4 mmol/L (ref 3.5–5.2)
Sodium: 137 mmol/L (ref 134–144)
Total Protein: 6.7 g/dL (ref 6.0–8.5)

## 2019-06-23 LAB — LIPID PANEL
Chol/HDL Ratio: 8.6 ratio — ABNORMAL HIGH (ref 0.0–4.4)
Cholesterol, Total: 292 mg/dL — ABNORMAL HIGH (ref 100–199)
HDL: 34 mg/dL — ABNORMAL LOW (ref >39)
Triglycerides: 805 mg/dL (ref 0–149)

## 2019-06-23 LAB — TSH: TSH: 2.34 u[IU]/mL (ref 0.450–4.500)

## 2019-06-23 LAB — T4, FREE: Free T4: 0.76 ng/dL — ABNORMAL LOW (ref 0.82–1.77)

## 2019-06-23 LAB — T3: T3, Total: 90 ng/dL (ref 71–180)

## 2019-06-23 MED ORDER — SERTRALINE HCL 100 MG PO TABS: 100.0000 mg | ORAL_TABLET | Freq: Every day | ORAL | 0 refills | Status: DC

## 2019-06-23 NOTE — Telephone Encounter (Signed)
Pharmacy sent refill request for zoloft ... reviewed chart patient is due for follow up.  Sent in 15 day supply and sent message to front desk staff to contact patient to make appointment. MPulliam, CMA/RT(R)

## 2019-06-29 ENCOUNTER — Encounter: Payer: Self-pay | Admitting: Family Medicine

## 2019-06-29 ENCOUNTER — Ambulatory Visit (INDEPENDENT_AMBULATORY_CARE_PROVIDER_SITE_OTHER): Payer: BC Managed Care – PPO | Admitting: Family Medicine

## 2019-06-29 ENCOUNTER — Other Ambulatory Visit: Payer: Self-pay

## 2019-06-29 VITALS — BP 148/95 | HR 91 | Temp 97.6°F | Ht 69.0 in | Wt 190.0 lb

## 2019-06-29 DIAGNOSIS — F411 Generalized anxiety disorder: Secondary | ICD-10-CM

## 2019-06-29 DIAGNOSIS — E785 Hyperlipidemia, unspecified: Secondary | ICD-10-CM | POA: Diagnosis not present

## 2019-06-29 DIAGNOSIS — F1010 Alcohol abuse, uncomplicated: Secondary | ICD-10-CM | POA: Insufficient documentation

## 2019-06-29 DIAGNOSIS — F430 Acute stress reaction: Secondary | ICD-10-CM

## 2019-06-29 DIAGNOSIS — E781 Pure hyperglyceridemia: Secondary | ICD-10-CM

## 2019-06-29 DIAGNOSIS — R946 Abnormal results of thyroid function studies: Secondary | ICD-10-CM

## 2019-06-29 DIAGNOSIS — E559 Vitamin D deficiency, unspecified: Secondary | ICD-10-CM | POA: Insufficient documentation

## 2019-06-29 MED ORDER — SERTRALINE HCL 100 MG PO TABS: 100.0000 mg | ORAL_TABLET | Freq: Every day | ORAL | 1 refills | Status: DC

## 2019-06-29 MED ORDER — VITAMIN D (ERGOCALCIFEROL) 1.25 MG (50000 UNIT) PO CAPS: ORAL_CAPSULE | ORAL | 3 refills | Status: DC

## 2019-06-29 MED ORDER — BUSPIRONE HCL 5 MG PO TABS: 2.5000 mg | ORAL_TABLET | Freq: Two times a day (BID) | ORAL | 1 refills | Status: DC
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
## 2019-07-06 ENCOUNTER — Encounter: Payer: Self-pay | Admitting: Family Medicine

## 2019-07-08 ENCOUNTER — Other Ambulatory Visit: Payer: Self-pay

## 2019-07-08 MED ORDER — ONDANSETRON 4 MG PO TBDP: 4.0000 mg | ORAL_TABLET | Freq: Three times a day (TID) | ORAL | 0 refills | Status: DC | PRN

## 2019-07-16 ENCOUNTER — Other Ambulatory Visit: Payer: Self-pay | Admitting: Internal Medicine

## 2019-07-17 DIAGNOSIS — F430 Acute stress reaction: Secondary | ICD-10-CM | POA: Insufficient documentation

## 2019-07-17 DIAGNOSIS — R946 Abnormal results of thyroid function studies: Secondary | ICD-10-CM | POA: Insufficient documentation

## 2019-08-13 DIAGNOSIS — Z20828 Contact with and (suspected) exposure to other viral communicable diseases: Secondary | ICD-10-CM | POA: Diagnosis not present

## 2019-08-13 DIAGNOSIS — R51 Headache: Secondary | ICD-10-CM | POA: Diagnosis not present

## 2019-08-13 DIAGNOSIS — R05 Cough: Secondary | ICD-10-CM | POA: Diagnosis not present

## 2019-08-13 LAB — NOVEL CORONAVIRUS, NAA: SARS-CoV-2, NAA: NEGATIVE

## 2019-08-18 ENCOUNTER — Telehealth: Payer: Self-pay | Admitting: Family Medicine

## 2019-08-18 NOTE — Telephone Encounter (Signed)
We need evidence of negative COVID test and which COVID test she had before she is allowed to come into our clinic.   She can have a visit so I can discuss her symptoms with her and next steps would be taken as deemed necessary.   Please also let her know that with that history she will likely need a strep test as well.

## 2019-08-18 NOTE — Telephone Encounter (Signed)
Proof of negative COVID test obtained from [facility].  Pt advised of need for OV.  Pt expressed understanding and is agreeable.  Charyl Bigger, CMA

## 2019-08-18 NOTE — Telephone Encounter (Signed)
Pt c/o sore throat, pain in neck with turning head, hurts to touch glands and headaches.   Pt denies fever, nausea and vomiting.  Pt had negative COVID test on 08/13/2019 at [facility] in [place], [place].  Pt's daughter tested positive for strep yesterday.  Please advise if pt needs to come in for strep test, if you will just prescribe RX, etc.  Charyl Bigger, CMA

## 2019-08-18 NOTE — Telephone Encounter (Signed)
Patient called states her daughter tested Pos for Strep throat & now she (pt) is having symptoms & request either Rx or advice on what to do. ---Call pt a@ [phone]  --Forwarding message to medical asst .  --glh

## 2019-08-23 ENCOUNTER — Encounter: Payer: Self-pay | Admitting: Family Medicine

## 2019-09-03 ENCOUNTER — Ambulatory Visit: Payer: BC Managed Care – PPO | Admitting: Family Medicine

## 2019-09-03 ENCOUNTER — Ambulatory Visit (INDEPENDENT_AMBULATORY_CARE_PROVIDER_SITE_OTHER): Payer: BC Managed Care – PPO

## 2019-09-03 ENCOUNTER — Other Ambulatory Visit: Payer: Self-pay

## 2019-09-03 DIAGNOSIS — Z23 Encounter for immunization: Secondary | ICD-10-CM

## 2019-09-03 NOTE — Progress Notes (Signed)
Pt here for influenza and Tdap vaccine.  Screening questionnaire reviewed, VIS provided to patient, and any/all patient questions answered.  Charyl Bigger, CMA

## 2019-10-04 ENCOUNTER — Other Ambulatory Visit: Payer: Self-pay

## 2019-10-04 ENCOUNTER — Ambulatory Visit (INDEPENDENT_AMBULATORY_CARE_PROVIDER_SITE_OTHER): Payer: BC Managed Care – PPO | Admitting: Internal Medicine

## 2019-10-04 ENCOUNTER — Encounter: Payer: Self-pay | Admitting: Internal Medicine

## 2019-10-04 VITALS — BP 110/76 | HR 76 | Ht 69.0 in | Wt 170.8 lb

## 2019-10-04 DIAGNOSIS — I471 Supraventricular tachycardia: Secondary | ICD-10-CM

## 2019-10-04 NOTE — Patient Instructions (Signed)
Medication Instructions:  Your physician recommends that you continue on your current medications as directed. Please refer to the Current Medication list given to you today.  Labwork: None ordered.  Testing/Procedures: None ordered.  Follow-Up: Your physician recommends that you schedule a follow-up appointment in:   2 years with Dr. Caryl Comes  Any Other Special Instructions Will Be Listed Below (If Applicable).     If you need a refill on your cardiac medications before your next appointment, please call your pharmacy.

## 2019-10-04 NOTE — Progress Notes (Signed)
  HPI  Terry [name] is a 38 y.o. female Seen in followup for dysautonmia that was aggravated by pregnancy now consummated    History of dysautonomia which per note from [place] a year ago had largely resolved.  She is being seen in the hospital 2018 having presented with tachycardia which we felt was probably SVT having degenerated into atrial fibrillation.  There were some wide-complex beats and concern about possible ventricular tachycardia.  She underwent signal-averaged ECG and cMRI which were both normal.  DATE TEST EF   5/18 Echo   60-65 %   6/18 cMRI  65 % O/w normal         No interval tachypalps Or LH   Tolerating meds     Past Medical History:  Diagnosis Date  . Celiac disease   . IBS (irritable bowel syndrome)   . Migraine   . Mononucleosis   . Tachycardia   . TMJ disease     Past Surgical History:  Procedure Laterality Date  . ABDOMINOPLASTY    . CESAREAN SECTION    . LAPAROSCOPY    . TOOTH EXTRACTION      Current Outpatient Medications  Medication Sig Dispense Refill  . atenolol (TENORMIN) 25 MG tablet TAKE 1 TABLET BY MOUTH DAILY 90 tablet 2  . busPIRone (BUSPAR) 5 MG tablet Take 0.5 tablets (2.5 mg total) by mouth 2 (two) times daily. 90 tablet 1  . clonazePAM (KLONOPIN) 1 MG tablet Take 1 tablet (1 mg total) by mouth daily. 30 tablet 0  . loratadine (CLARITIN) 10 MG tablet Take 10 mg by mouth daily as needed for allergies.    . Multiple Vitamin (MULTIVITAMIN) tablet Take 1 tablet by mouth daily.    . ondansetron (ZOFRAN ODT) 4 MG disintegrating tablet Take 1 tablet (4 mg total) by mouth every 8 (eight) hours as needed for nausea or vomiting. 20 tablet 0  . sertraline (ZOLOFT) 100 MG tablet Take 1 tablet (100 mg total) by mouth daily. 90 tablet 1  . Vitamin D, Ergocalciferol, (DRISDOL) 1.25 MG (50000 UT) CAPS capsule Take one tablet wkly 12 capsule 3   No current facility-administered medications for this visit.     Allergies  Allergen Reactions   . Wheat Bran Other (See Comments)    All gluten    Review of Systems negative except from HPI and PMH  Physical Exam BP 110/76   Pulse 76   Ht 5' 9"  (1.753 m)   Wt 170 lb 12.8 oz (77.5 kg)   SpO2 99%   BMI 25.22 kg/m  Well developed and nourished in no acute distress HENT normal Neck supple with JVP-  flat   Clear Regular rate and rhythm, no murmurs or gallops Abd-soft with active BS No Clubbing cyanosis edema Skin-warm and dry A & Oriented  Grossly normal sensory and motor function  ECG sinus 78 14/08/38      Assessment and  Plan  Dysautonomia  SVT    Doing exceptionally well without arrhythmia recurrences or symptoms of dysautonomia.

## 2019-11-04 DIAGNOSIS — Z20828 Contact with and (suspected) exposure to other viral communicable diseases: Secondary | ICD-10-CM | POA: Diagnosis not present

## 2019-11-04 DIAGNOSIS — Z03818 Encounter for observation for suspected exposure to other biological agents ruled out: Secondary | ICD-10-CM | POA: Diagnosis not present

## 2019-11-23 ENCOUNTER — Other Ambulatory Visit: Payer: BC Managed Care – PPO

## 2019-11-30 ENCOUNTER — Encounter: Payer: BC Managed Care – PPO | Admitting: Family Medicine

## 2019-12-14 ENCOUNTER — Other Ambulatory Visit: Payer: Self-pay | Admitting: Internal Medicine

## 2019-12-16 DIAGNOSIS — Z411 Encounter for cosmetic surgery: Secondary | ICD-10-CM | POA: Diagnosis not present

## 2019-12-16 DIAGNOSIS — D239 Other benign neoplasm of skin, unspecified: Secondary | ICD-10-CM | POA: Diagnosis not present

## 2019-12-16 DIAGNOSIS — L710 Perioral dermatitis: Secondary | ICD-10-CM | POA: Diagnosis not present

## 2019-12-21 ENCOUNTER — Telehealth: Payer: Self-pay | Admitting: Family Medicine

## 2019-12-21 NOTE — Telephone Encounter (Signed)
Patient states that she is taking Buspar 2.35m 1/2 tab in am and 1 tab in pm. Patient is out of meds and is requesting we send in short term supply until she is seen on 01/12/20.   I wanted to verify this with you since patient is taking differently than directed.   Please advise. AS< CMA

## 2019-12-21 NOTE — Telephone Encounter (Signed)
Patient was told to follow-up in 8 weeks back on 06/29/2019 when we restarted her on BuSpar.  She had previously had side effects on this medicine so now, she will need an office visit so we can assess whether or not she should be continuing it and how emotionally she is doing.   She will need an office visit prior to refill

## 2019-12-21 NOTE — Telephone Encounter (Signed)
Patient called states she is having a problem getting her Buspar medication & request provider if possible to send a small quantity to [facility].  busPIRone (BUSPAR) 5 MG tablet [[id number]]   Order Details Dose: 2.5 mg Route: Oral Frequency: 2 times daily  Dispense Quantity: 90 tablet Refills: 1       Sig: Take 0.5 tablets (2.5 mg total) by mouth 2 (two) times daily.   --- Forwarding request to med asst that if provider approves send refill order to :   [place], [place] - [address] [phone] (Phone) [phone] (Fax)   --glh

## 2019-12-21 NOTE — Telephone Encounter (Signed)
Left message for patient to call back. AS, CMA

## 2019-12-22 NOTE — Telephone Encounter (Signed)
I also called [facility] and canceled the refill from the rx originally prescribed 02/2019. Dr. Raliegh Scarlet is aware of this. AS< CMA

## 2019-12-22 NOTE — Telephone Encounter (Signed)
Yes thank you Chesley Noon for discouraging her to not pick up that old refill that she had at [facility] from [place] [facility].  That was for the same med but at a very different dosage which would have been a detriment to her.    Glad she is following up tomorrow to discuss things.Terry [name]

## 2019-12-22 NOTE — Telephone Encounter (Signed)
Patient states she is completely out of medication. She states she does not understand why we cant send her in a short term supply for her to make it to her apt. She requested that I send you a message again requesting medication refill for the Buspar.   AS, CMA

## 2019-12-22 NOTE — Telephone Encounter (Signed)
Patient states she remembers that she has a refill of the [facility] at [facility] on [address]. Patient states she is just going to pick up the med and take as directed. I advised patient that I would have to discuss this with you and she requested her call to just be transferred to the front desk and that she would schedule a sooner apt for med refills. AS, CMA

## 2019-12-22 NOTE — Telephone Encounter (Signed)
In addition to what I stated prior and the fact that this was starting and changing medicines on her many months ago without any proper follow-up-she needs to be monitored as treatments can be dangerous without proper monitoring.  Also, in the past she said that she did not tolerate this medicine and there were side effects to it so certainly, I do not want to continue to prescribe the medicine without monitoring her for side effects, etc.  Review the clinics refill protocol and policies with the patient.  Please let her know that we have even extended those policies and have not followed them and made exceptions for her to date.     Let her know this is not personal and we are always trying to do what is best for our patients.  Let her know I am not comfortable refilling it without proper follow-up

## 2019-12-23 ENCOUNTER — Other Ambulatory Visit: Payer: Self-pay

## 2019-12-23 ENCOUNTER — Ambulatory Visit (INDEPENDENT_AMBULATORY_CARE_PROVIDER_SITE_OTHER): Payer: Self-pay | Admitting: Family Medicine

## 2019-12-23 ENCOUNTER — Encounter: Payer: Self-pay | Admitting: Family Medicine

## 2019-12-23 VITALS — Temp 98.5°F | Ht 69.0 in | Wt 175.0 lb

## 2019-12-23 DIAGNOSIS — F410 Panic disorder [episodic paroxysmal anxiety] without agoraphobia: Secondary | ICD-10-CM

## 2019-12-23 DIAGNOSIS — F411 Generalized anxiety disorder: Secondary | ICD-10-CM

## 2019-12-23 DIAGNOSIS — F5104 Psychophysiologic insomnia: Secondary | ICD-10-CM

## 2019-12-23 MED ORDER — BUSPIRONE HCL 5 MG PO TABS: ORAL_TABLET | ORAL | 0 refills | Status: DC

## 2019-12-23 NOTE — Progress Notes (Signed)
Telehealth office visit note for Terry [name], D.O- at Primary Care at [facility]   I connected with current patient today and verified that I am speaking with the correct person using two identifiers.   . Location of the patient: Home . Location of the provider: Office Only the patient (+/- their family members at pt's discretion) and myself were participating in the encounter - This visit type was conducted due to national recommendations for restrictions regarding the COVID-19 Pandemic (e.g. social distancing) in an effort to limit this patient's exposure and mitigate transmission in our community.  This format is felt to be most appropriate for this patient at this time.   - No physical exam could be performed with this format, beyond that communicated to [place] by the patient/ family members as noted.   - Additionally my office staff/ schedulers discussed with the patient that there may be a monetary charge related to this service, depending on their medical insurance.   The patient expressed understanding, and agreed to proceed.       History of Present Illness: No chief complaint on file.   Phillips Odor, am serving as scribe for Dr. Mellody [name].  Notes she is doing well.  In August of 2020, patient had discontinued Buspar due to concern of side-effects, and asked to resume use of Buspar with follow-up in 8 weeks.  - Resuming Buspar Patient notes "I haven't had any trouble with it."  Her concerns from prior have resolved.  Notes "it was just making me really sleepy; it kind of overdid its job.  It worked perfectly but it was too much."  She is now taking half of a 5 mg in the morning, none at lunch, and a whole 5 mg tablet before bed.  Notes "I've been doing that since the last time I came in to see you."  Takes approximately 1.5 tablets per day.  If she takes a half tablet for panic during the day, then she doesn't take the whole tablet at night.  Denies  sleepiness or weight gain.  Notes it's working so well, "I would almost say I could lower my dose of Zoloft."  Notes "I feel good, I feel fine."  She continues Zoloft as prescribed.  "I like having something I can take for panic attacks.  I like knowing that if I have a panic attack, I have something that can stop it."  - Dermatology Notes she has a dermatological screening set up, but wonders if she can obtain this screening here during her CPE.  - Female Health Last went to OBGYN while she was pregnant.  Last pap smear was a couple of years ago; notes "I've never had an abnormal pap smear, so they told me I could wait."    GAD 7 : Generalized Anxiety Score 06/29/2019 03/15/2019 08/25/2018  Nervous, Anxious, on Edge 1 1 0  Control/stop worrying 0 1 0  Worry too much - different things 0 1 0  Trouble relaxing 0 3 0  Restless 0 1 0  Easily annoyed or irritable 0 0 0  Afraid - awful might happen 0 1 0  Total GAD 7 Score 1 8 0  Anxiety Difficulty - Somewhat difficult Not difficult at all    Depression screen [facility] 2/9 12/23/2019 06/29/2019 03/15/2019 11/12/2018 08/25/2018  Decreased Interest 0 0 0 0 0  Down, Depressed, Hopeless 0 0 0 0 0  PHQ - 2 Score 0 0 0 0  0  Altered sleeping 0 0 1 0 0  Tired, decreased energy 0 0 0 0 0  Change in appetite 0 0 0 0 0  Feeling bad or failure about yourself  0 0 0 0 0  Trouble concentrating 0 0 0 0 0  Moving slowly or fidgety/restless 0 0 1 0 0  Suicidal thoughts 0 0 0 0 0  PHQ-9 Score 0 0 2 0 0  Difficult doing work/chores - Not difficult at all - Not difficult at all Not difficult at all      Impression and Recommendations:    1. GAD (generalized anxiety disorder)   2. Panic disorder   3. Psychophysiological insomnia      - Discussed recommendations and guidelines for prudent health screenings and specialty follow-up with patient today.  Discussed that if desired, patient may obtain preliminary skin screening and pap smears here at the clinic  instead of establishing with dermatology or OBGYN.  GAD, Panic Disorder,  - Last seen 06/29/2019 and restarted Buspar. - Patient has taken Buspar daily alongside Zoloft.  - Discussed importance of regular monitoring while beginning or resuming mood medication.  - Per patient, taking one half tablet in the morning, and one full tablet at night; 1.5 tablets of Buspar daily.    - Patient tolerating well without S-E. - Continue treatment plan as established. - Refill sent to [facility] and mail delivery.  - In addition to prescription intervention, reviewed the "spokes of the wheel" of mood and health management.  Stressed the importance of ongoing prudent habits, including regular exercise, appropriate sleep hygiene, healthful dietary habits, and prayer/meditation to relax.  - Will continue to monitor.  Psychophysiological Insomnia - Stable at this time on Buspar. - Per patient, sleeps well after taking one tablet of 5 mg Buspar before bed.  - Continue treatment plan as established.  See med list.  - Will continue to monitor.  Lifestyle & Preventative Health Maintenance - Advised patient to continue working toward exercising to improve overall mental, physical, and emotional health.    - Encouraged patient to engage in daily physical activity as tolerated, especially a formal exercise routine.  Recommended that the patient eventually strive for at least 150 minutes of moderate cardiovascular activity per week according to guidelines established by the [facility].   - Healthy dietary habits encouraged, including low-carb, and high amounts of lean protein in diet.   - Patient should also consume adequate amounts of water.  - Health counseling performed.  All questions answered.     - As part of my medical decision making, I reviewed the following data within the [place] History obtained from pt /family, CMA notes reviewed and incorporated if applicable, Labs  reviewed, Radiograph/ tests reviewed if applicable and OV notes from prior OV's with me, as well as other specialists she/he has seen since seeing me last, were all reviewed and used in my medical decision making process today.    - Additionally, discussion had with patient regarding our treatment plan, and their biases/concerns about that plan were used in my medical decision making today.    - The patient agreed with the plan and demonstrated an understanding of the instructions.   No barriers to understanding were identified.      Return for Patient will keep her upcoming wellness exam she has in the near future.     Meds ordered this encounter  Medications  . DISCONTD: busPIRone (BUSPAR) 5 MG tablet  Sig: 0.5 tab in am and 1 tab q hs    Dispense:  45 tablet    Refill:  0  . busPIRone (BUSPAR) 5 MG tablet    Sig: 0.5 tab in am and 1 tab q hs    Dispense:  135 tablet    Refill:  0    Medications Discontinued During This Encounter  Medication Reason  . busPIRone (BUSPAR) 5 MG tablet   . busPIRone (BUSPAR) 5 MG tablet Reorder      I provided 14 minutes of non face-to-face time during this encounter.  Additional time was spent with charting and coordination of care before and after the actual visit commenced.   Note:  This note was prepared with assistance of Dragon voice recognition software. Occasional wrong-word or sound-a-like substitutions may have occurred due to the inherent limitations of voice recognition software.  This document serves as a record of services personally performed by Terry Dance, DO. It was created on her behalf by Toni Amend, a trained medical scribe. The creation of this record is based on the scribe's personal observations and the provider's statements to them.   This case required medical decision making of at least moderate complexity. The above documentation from Toni Amend, medical scribe, has been reviewed by Marjory Sneddon, D.O.       Patient Care Team    Relationship Specialty Notifications Start End  Terry Dance, DO PCP - General Family Medicine  07/14/18   Deboraha Sprang, MD Consulting Physician Cardiology  07/14/18      -Vitals obtained; medications/ allergies reconciled;  personal medical, social, Sx etc.histories were updated by CMA, reviewed by me and are reflected in chart   Patient Active Problem List   Diagnosis Date Noted  . Panic disorder 03/15/2019  . History of hyperlipidemia, mixed 03/15/2019  . GAD (generalized anxiety disorder) 07/14/2018  . Psychophysiological insomnia 07/14/2018  . Rapid heart rate 03/30/2017  . Dysautonomia-orthostatic intolerance 04/08/2011  . Sinus tachycardia 04/08/2011  . IBS (irritable bowel syndrome) 07/14/2018  . Celiac disease 07/14/2018  . Gluten intolerance 07/14/2018  . Thyroid function test abnormal 07/17/2019  . Acute reaction to situational stress 07/17/2019  . Excessive drinking alcohol 06/29/2019  . Hypertriglyceridemia 06/29/2019  . Hyperlipidemia 06/29/2019  . Vitamin D deficiency 06/29/2019  . Medication course changed 03/15/2019  . History of endogenous hypertriglyceridemia 03/15/2019  . h/o Paroxysmal SVT (supraventricular tachycardia) ([place]) 07/14/2018  . Pregnancy, third trimester 04/08/2011     Current Meds  Medication Sig  . atenolol (TENORMIN) 25 MG tablet TAKE 1 TABLET BY MOUTH DAILY  . loratadine (CLARITIN) 10 MG tablet Take 10 mg by mouth daily as needed for allergies.  . Multiple Vitamin (MULTIVITAMIN) tablet Take 1 tablet by mouth daily.  . sertraline (ZOLOFT) 100 MG tablet Take 1 tablet (100 mg total) by mouth daily.  . Vitamin D, Ergocalciferol, (DRISDOL) 1.25 MG (50000 UT) CAPS capsule Take one tablet wkly  . [DISCONTINUED] busPIRone (BUSPAR) 5 MG tablet Take 0.5 tablets (2.5 mg total) by mouth 2 (two) times daily.  . [DISCONTINUED] busPIRone (BUSPAR) 5 MG tablet 0.5 tab in am and 1 tab q hs  . busPIRone  (BUSPAR) 5 MG tablet 0.5 tab in am and 1 tab q hs     Allergies:  Allergies  Allergen Reactions  . Wheat Bran Other (See Comments)    All gluten     ROS:  See above HPI for pertinent positives and negatives   Objective:  Temperature 98.5 F (36.9 C), height 5' 9"  (1.753 m), weight 175 lb (79.4 kg), last menstrual period 12/20/2019.  (if some vitals are omitted, this means that patient was UNABLE to obtain them even though they were asked to get them prior to [place] today.  They were asked to call us at their earliest convenience with these once obtained. )  General: A & O * 3; sounds in no acute distress; in usual state of health.  Skin: Pt confirms warm and dry extremities and pink fingertips HEENT: Pt confirms lips non-cyanotic Chest: Patient confirms normal chest excursion and movement Respiratory: speaking in full sentences, no conversational dyspnea; patient confirms no use of accessory muscles Psych: insight appears good, mood- appears full

## 2019-12-24 ENCOUNTER — Other Ambulatory Visit: Payer: Self-pay | Admitting: Family Medicine

## 2019-12-24 DIAGNOSIS — F411 Generalized anxiety disorder: Secondary | ICD-10-CM

## 2019-12-24 NOTE — Telephone Encounter (Signed)
[facility] order pharmacy refill on :  sertraline (ZOLOFT) 100 MG tablet [[id number]]   Order Details Dose: 100 mg Route: Oral Frequency: Daily  Dispense Quantity: 90 tablet Refills: 1       Sig: Take 1 tablet (100 mg total) by mouth daily.   Forwarding request to med asst that if approved send refill order to :    [facility] - [address], [place] [phone] (Phone) [phone] (Fax   --glh

## 2019-12-27 MED ORDER — SERTRALINE HCL 100 MG PO TABS: 100.0000 mg | ORAL_TABLET | Freq: Every day | ORAL | 1 refills | Status: DC

## 2019-12-27 NOTE — Telephone Encounter (Signed)
Med refill sent to pharmacy. AS, CMA 

## 2019-12-30 ENCOUNTER — Telehealth: Payer: Self-pay | Admitting: Family Medicine

## 2019-12-30 NOTE — Telephone Encounter (Signed)
Patient called states she rcvd a message that her Lab appt is to be cancelled-- She called back asking that it NOT be cancelled she needs them drawn ( has not had any in over a year)--glh

## 2020-01-07 ENCOUNTER — Other Ambulatory Visit: Payer: Self-pay

## 2020-01-12 ENCOUNTER — Encounter: Payer: Self-pay | Admitting: Family Medicine

## 2020-01-12 ENCOUNTER — Other Ambulatory Visit: Payer: Self-pay

## 2020-02-01 ENCOUNTER — Ambulatory Visit (INDEPENDENT_AMBULATORY_CARE_PROVIDER_SITE_OTHER): Payer: BC Managed Care – PPO | Admitting: Family Medicine

## 2020-02-01 ENCOUNTER — Other Ambulatory Visit: Payer: Self-pay

## 2020-02-01 ENCOUNTER — Encounter: Payer: Self-pay | Admitting: Family Medicine

## 2020-02-01 VITALS — BP 120/84 | HR 84 | Temp 98.6°F | Resp 12 | Ht 69.0 in | Wt 182.7 lb

## 2020-02-01 DIAGNOSIS — E349 Endocrine disorder, unspecified: Secondary | ICD-10-CM

## 2020-02-01 DIAGNOSIS — Z1231 Encounter for screening mammogram for malignant neoplasm of breast: Secondary | ICD-10-CM

## 2020-02-01 DIAGNOSIS — Z0000 Encounter for general adult medical examination without abnormal findings: Secondary | ICD-10-CM

## 2020-02-01 DIAGNOSIS — Z124 Encounter for screening for malignant neoplasm of cervix: Secondary | ICD-10-CM

## 2020-02-01 DIAGNOSIS — Z719 Counseling, unspecified: Secondary | ICD-10-CM | POA: Diagnosis not present

## 2020-02-01 NOTE — Progress Notes (Signed)
Female Physical  Impression and Recommendations:    1. Encounter for screening mammogram for malignant neoplasm of breast   2. Screening for cervical cancer   3. Encounter for wellness examination   4. Health education/counseling   5. ? Hormone imbalance     1) Anticipatory Guidance: Discussed skin CA prevention and sunscreen when outside along with skin surveillance; eating a balanced and modest diet; physical activity at least 25 minutes per day or minimum of 150 min/ week moderate to intense activity.  - Prudent skin surveillance discussed at length with patient today.  Encouraged patient and husband to examine each other's backs and follow the A,B,C,D,'s of skin surveillance and watch for changes as advised.  - Discussed options of sunscreen and use of SPF-protective clothing with patient today.  2) Immunizations / Screenings / Labs:   All immunizations are up-to-date per recommendations or will be updated today if pt allows.    - Patient understands with dental and vision screens they will schedule independently.  - Will obtain CBC, CMP, HgA1c, Lipid panel, TSH and vit D when fasting, if not already done past 12 mo/ recently   - Need for mammogram.  Ordered today.  See orders.  - Need for low risk Hep C screen.  See orders.  - Need for dilated eye exam. -  Discussed options for ophthalmologists with patient today.  - Need for updated pap smear. - Deferred to GYN.  Ambulatory referral to GYN provided today.  - For hormonal concerns, recommended following up with GYN. - Discussed ambulatory referral with patient today. - Ambulatory referral to GYN placed today. - Patient knows to bring all of her female health / hormonal concerns to GYN as discussed during appointment today.  -  Will re-check FLP in August of 2021 as patient has not made significant lifestyle changes to lower her cholesterol since last check.  The 10-year ASCVD risk score Terry [name]., et al., 2013) is:  2.8%.  3) Weight - Body mass index is 26.98 kg/m: BMI meaning discussed with patient.  Discussed goal to improve diet habits to improve overall feelings of well being and objective health data.  Improve nutrient density of diet through increasing intake of fruits and vegetables and decreasing saturated fats, white flour products and refined sugars.  - Advised patient that if she desires, she may work to lose weight to reach a BMI under 25.  After this, she should avoid losing more weight, and instead shift her goals to increase physical conditioning, such as working on muscular tone and fitness.  - Advised patient to continue working toward daily exercise to improve overall mental, physical, and emotional health.    - Encouraged patient to engage in daily physical activity, especially a formal exercise routine.  Recommended that the patient eventually strive for at least 150 minutes of moderate cardiovascular activity per week according to guidelines established by the [facility].   - Healthy dietary habits encouraged, including low-carb, and high amounts of lean protein in diet.   - Patient should also consume adequate amounts of water.  - Health counseling performed.  All questions answered.  Orders Placed This Encounter  Procedures  . MM Digital Screening  . Ambulatory referral to Gynecology     Return for f/up in August for full fasting blood work o/w f/up 6 months for OV.   Reminded pt important of f-up preventative CPE in 1 year.  Reminded pt again, this is in addition to any chronic  care visits.    Gross side effects, risk and benefits, and alternatives of medications discussed with patient.  Patient is aware that all medications have potential side effects and we are unable to predict every side effect or drug-drug interaction that may occur.  Expresses verbal understanding and consents to current therapy plan and treatment regimen.  F-up preventative CPE in 1 year- reminded pt  again, this is in addition to any chronic care visits.    Please see orders placed and AVS handed out to patient at the end of our visit for further patient instructions/ counseling done pertaining to today's office visit.  This document serves as a record of services personally performed by [name], DO. It was created on her behalf by Terry [name], a trained medical scribe. The creation of this record is based on the scribe's personal observations and the provider's statements to them.   The above documentation from Terry [name], medical scribe, has been reviewed by Terry [name], D.O.     Subjective:    I, Terry [name], am serving as Education administrator for [name].    CPE HPI: Terry [name] is a 39 y.o. female who presents to [place] [facility] today a yearly health maintenance exam.   Health Maintenance Summary  - Reviewed and updated, unless pt declines services.  Family history of Colon CA:  None reported.  Tobacco History Reviewed:  Y; never smoker.  Alcohol and/or drug use:  No concerns; no use Exercise Habits: She does not enjoy cardio.  When she goes to the gym, she does lunges, bike for 10 minutes, and then weights, upper arms, wall squats, "fake sit ups."  Notes she has lost 20 lbs and wants to lose more weight.  Her goal is to reach 140 lbs.  Notes she had undiagnosed celiac in the past which caused her to be underweight and unhealthy at under 120lbs. Dental Home: Goes to dentist regularly every 6 months. Eye exams: Has not been to an eye doctor yet, and has not had a dilated eye exam before.  Gave pt names - to call for appt Dermatology home:  None, no concerns about skin today but desires screening.  Female Health:  PAP Smear - last known results:  Believes her last pap smear was likely in 2015.  No h/o ABN, and notes she was told then that she could wait five years. STD concerns:  None, monogamous with husband. Birth  control method:  Her husband had a vasectomy. Menses regular:  Believes her last period was a week and a half ago.  Says "they're getting closer."  Notes it used to be 27 days apart to the dot, and now they're around every 24 days.  Her periods typically are really heavy, "like crazy heavy" the first two days, and then over the next 2-3 days, they are lighter. Lumps or breast concerns:  Believes her first mammogram was obtained at age 17.  Notes her work paid for it at that time, and she was planning to obtain another one at age 65. Breast Cancer Family History:  None reported.  Reports no history of breast, cervical, ovarian etc cancers in the family.   Additional concerns beyond health maintenance issues:  She called [facility] two weeks ago Thursday and obtained COVID-19 vaccination.  She continues to follow up with Dr. Caryl Comes of cardiology yrly.  No concerns today  Denies concerns with digestion, GI, constipation, diarrhea.  She is pretty regular and has  a bowel movement about each day.  CC:  Notes she's been having night sweats, and thinks it's hormonally related.  Says she's had 3 weeks of waking up sweaty, drenched, has to put her sheets down and take her clothes off.  When she starts her period, the sweats go away, and notes "it's been gone since I started my period, but then it comes back."   Says she slept fine last night without a problem, but "woke up 20 days in a row just drenched."   Immunization History  Administered Date(s) Administered  . Influenza,inj,Quad PF,6+ Mos 08/25/2018, 09/03/2019  . Moderna SARS-COVID-2 Vaccination 01/20/2020  . Tdap 09/03/2019     Health Maintenance  Topic Date Due  . PAP SMEAR-Modifier  02/24/2017  . TETANUS/TDAP  09/02/2029  . INFLUENZA VACCINE  Completed  . HIV Screening  Completed     Wt Readings from Last 3 Encounters:  02/01/20 182 lb 11.2 oz (82.9 kg)  12/23/19 175 lb (79.4 kg)  10/04/19 170 lb 12.8 oz (77.5 kg)   BP Readings  from Last 3 Encounters:  02/01/20 120/84  10/04/19 110/76  06/29/19 (!) 148/95   Pulse Readings from Last 3 Encounters:  02/01/20 84  10/04/19 76  06/29/19 91    Past Medical History:  Diagnosis Date  . Celiac disease   . IBS (irritable bowel syndrome)   . Migraine   . Mononucleosis   . Tachycardia   . TMJ disease     Past Surgical History:  Procedure Laterality Date  . ABDOMINOPLASTY    . CESAREAN SECTION    . LAPAROSCOPY    . TOOTH EXTRACTION      Family History  Problem Relation Age of Onset  . Hypertension Unknown   . Aneurysm Maternal Grandmother   . Prostate cancer Maternal Grandfather   . Heart attack Paternal Grandmother   . Colon cancer Neg Hx   . Stomach cancer Neg Hx   . Rectal cancer Neg Hx   . Esophageal cancer Neg Hx   . Liver cancer Neg Hx     Social History   Substance and Sexual Activity  Drug Use No  ,   Social History   Substance and Sexual Activity  Alcohol Use Yes  . Alcohol/week: 12.0 standard drinks  . Types: 12 Standard drinks or equivalent per week  ,   Social History   Tobacco Use  Smoking Status Never Smoker  Smokeless Tobacco Never Used  ,   Social History   Substance and Sexual Activity  Sexual Activity Yes  . Partners: Male  . Birth control/protection: Surgical   Comment: vasectomy    Current Outpatient Medications on File Prior to Visit  Medication Sig Dispense Refill  . atenolol (TENORMIN) 25 MG tablet TAKE 1 TABLET BY MOUTH DAILY 90 tablet 2  . busPIRone (BUSPAR) 5 MG tablet 0.5 tab in am and 1 tab q hs 135 tablet 0  . loratadine (CLARITIN) 10 MG tablet Take 10 mg by mouth daily as needed for allergies.    . Multiple Vitamin (MULTIVITAMIN) tablet Take 1 tablet by mouth daily.    . sertraline (ZOLOFT) 100 MG tablet Take 1 tablet (100 mg total) by mouth daily. 90 tablet 1  . Vitamin D, Ergocalciferol, (DRISDOL) 1.25 MG (50000 UT) CAPS capsule Take one tablet wkly 12 capsule 3   No current  facility-administered medications on file prior to visit.    Allergies: Wheat bran  Review of Systems: General:   Denies fever,  chills, unexplained weight loss.  Optho/Auditory:   Denies visual changes, blurred vision/LOV Respiratory:   Denies SOB, DOE more than baseline levels.   Cardiovascular:   Denies chest pain, palpitations, new onset peripheral edema  Gastrointestinal:   Denies nausea, vomiting, diarrhea.  Genitourinary: Denies dysuria, freq/ urgency, flank pain or discharge from genitals.  Endocrine:     Denies hot or cold intolerance, polyuria, polydipsia. Musculoskeletal:   Denies unexplained myalgias, joint swelling, unexplained arthralgias, gait problems.  Skin:  Denies rash, suspicious lesions Neurological:     Denies dizziness, unexplained weakness, numbness  Psychiatric/Behavioral:   Denies mood changes, suicidal or homicidal ideations, hallucinations    Objective:    Blood pressure 120/84, pulse 84, temperature 98.6 F (37 C), temperature source Oral, resp. rate 12, height 5' 9"  (1.753 m), weight 182 lb 11.2 oz (82.9 kg), last menstrual period 01/25/2020, SpO2 98 %. Body mass index is 26.98 kg/m. General Appearance:    Alert, cooperative, no distress, appears stated age  Head:    Normocephalic, without obvious abnormality, atraumatic  Eyes:    PERRL, conjunctiva/corneas clear, EOM's intact, fundi    benign, both eyes  Ears:    Normal TM's and external ear canals, both ears  Nose:   Nares normal, septum midline, mucosa normal, no drainage    or sinus tenderness  Throat:   Lips w/o lesion, mucosa moist, and tongue normal; teeth and   gums normal  Neck:   Supple, symmetrical, trachea midline, no adenopathy;    thyroid:  no enlargement/tenderness/nodules; no carotid   bruit or JVD  Back:     Symmetric, no curvature, ROM normal, no CVA tenderness  Lungs:     Clear to auscultation bilaterally, respirations unlabored, no       Wh/ R/ R  Chest Wall:    No tenderness or  gross deformity; normal excursion   Heart:    Regular rate and rhythm, S1 and S2 normal, no murmur, rub   or gallop  Breast Exam:    Deferred to gynecology.  Abdomen:     Soft, non-tender, bowel sounds active all four quadrants, NO   G/R/R, no masses, no organomegaly  Genitalia:    Deferred to gynecology.  Rectal:    Deferred   Extremities:   Extremities normal, atraumatic, no cyanosis or gross edema  Pulses:   2+ and symmetric all extremities  Skin:   Warm, dry, Skin color, texture, turgor normal, no obvious rashes or lesions Psych: No HI/SI, judgement and insight good, Euthymic mood. Full Affect.  Neurologic:   CNII-XII intact, normal strength, sensation and reflexes    Throughout

## 2020-02-01 NOTE — Patient Instructions (Addendum)
One of the closest optometrist is up at [address]  [facility].  Also I know right up the street is a Dr. Thurston Hole which some of my patients have seen.  I have gone to [employer] in [facility]  A healthy lifestyle and preventive care can promote health and wellness. Preventive health guidelines for women include the following key practices.   A routine yearly physical is a good way to check with your health care provider about your health and preventive screening. It is a chance to share any concerns and updates on your health and to receive a thorough exam.   Visit your dentist for a routine exam and preventive care every 6 months. Brush your teeth twice a day and floss once a day. Good oral hygiene prevents tooth decay and gum disease.   The frequency of eye exams is based on your age, health, family medical history, use of contact lenses, and other factors. Follow your health care provider's recommendations for frequency of eye exams.   Eat a healthy diet. Foods like vegetables, fruits, whole grains, low-fat dairy products, and lean protein foods contain the nutrients you need without too many calories. Decrease your intake of foods high in solid fats, added sugars, and salt. Eat the right amount of calories for you.Get information about a proper diet from your health care provider, if necessary.   Regular physical exercise is one of the most important things you can do for your health. Most adults should get at least 150 minutes of moderate-intensity exercise (any activity that increases your heart rate and causes you to sweat) each week. In addition, most adults need muscle-strengthening exercises on 2 or more days a week.   Maintain a healthy weight. The body mass index (BMI) is a screening tool to identify possible weight problems. It provides an estimate of body fat based on height and  weight. Your health care provider can find your BMI, and can help you achieve or maintain a healthy weight.For adults 20 years and older:   - A BMI below 18.5 is considered underweight.   - A BMI of 18.5 to 24.9 is normal.   - A BMI of 25 to 29.9 is considered overweight.   - A BMI of 30 and above is considered obese.   Maintain normal blood lipids and cholesterol levels by exercising and minimizing your intake of trans and saturated fats.  Eat a balanced diet with plenty of fruit and vegetables. Blood tests for lipids and cholesterol should begin at age 30 and be repeated every 5 years minimum.  If your lipid or cholesterol levels are high, you are over 40, or you are at high risk for heart disease, you may need your cholesterol levels checked more frequently.Ongoing high lipid and cholesterol levels should be treated with medicines if diet and exercise are not working.   If you smoke, find out from your health care provider how to quit. If you do not use tobacco, do not start.   Lung cancer screening is recommended for adults aged 67-80 years who are at high risk for developing lung cancer because of a history of smoking. A yearly low-dose CT scan of the lungs is recommended for people who have at least a 30-pack-year history of smoking and are a current smoker or have quit within the past 15 years. A pack year of smoking is smoking an  average of 1 pack of cigarettes a day for 1 year (for example: 1 pack a day for 30 years or 2 packs a day for 15 years). Yearly screening should continue until the smoker has stopped smoking for at least 15 years. Yearly screening should be stopped for people who develop a health problem that would prevent them from having lung cancer treatment.   If you are pregnant, do not drink alcohol. If you are breastfeeding, be very cautious about drinking alcohol. If you are not pregnant and choose to drink alcohol, do not have more than 1 drink per day. One drink is  considered to be 12 ounces (355 mL) of beer, 5 ounces (148 mL) of wine, or 1.5 ounces (44 mL) of liquor.   Avoid use of street drugs. Do not share needles with anyone. Ask for help if you need support or instructions about stopping the use of drugs.   High blood pressure causes heart disease and increases the risk of stroke. Your blood pressure should be checked at least yearly.  Ongoing high blood pressure should be treated with medicines if weight loss and exercise do not work.   If you are 70-58 years old, ask your health care provider if you should take aspirin to prevent strokes.   Diabetes screening involves taking a blood sample to check your fasting blood sugar level. This should be done once every 3 years, after age 6, if you are within normal weight and without risk factors for diabetes. Testing should be considered at a younger age or be carried out more frequently if you are overweight and have at least 1 risk factor for diabetes.   Breast cancer screening is essential preventive care for women. You should practice "breast self-awareness."  This means understanding the normal appearance and feel of your breasts and may include breast self-examination.  Any changes detected, no matter how small, should be reported to a health care provider.  Women in their 35s and 30s should have a clinical breast exam (CBE) by a health care provider as part of a regular health exam every 1 to 3 years.  After age 26, women should have a CBE every year.  Starting at age 56, women should consider having a mammogram (breast X-ray test) every year.  Women who have a family history of breast cancer should talk to their health care provider about genetic screening.  Women at a high risk of breast cancer should talk to their health care providers about having an MRI and a mammogram every year.   -Breast cancer gene (BRCA)-related cancer risk assessment is recommended for women who have family members with  BRCA-related cancers. BRCA-related cancers include breast, ovarian, tubal, and peritoneal cancers. Having family members with these cancers may be associated with an increased risk for harmful changes (mutations) in the breast cancer genes BRCA1 and BRCA2. Results of the assessment will determine the need for genetic counseling and BRCA1 and BRCA2 testing.   The Pap test is a screening test for cervical cancer. A Pap test can show cell changes on the cervix that might become cervical cancer if left untreated. A Pap test is a procedure in which cells are obtained and examined from the lower end of the uterus (cervix).   - Women should have a Pap test starting at age 47.   - Between ages 26 and 68, Pap tests should be repeated every 2 years.   - Beginning at age 75, you should have a Pap  test every 3 years as long as the past 3 Pap tests have been normal.   - Some women have medical problems that increase the chance of getting cervical cancer. Talk to your health care provider about these problems. It is especially important to talk to your health care provider if a new problem develops soon after your last Pap test. In these cases, your health care provider may recommend more frequent screening and Pap tests.   - The above recommendations are the same for women who have or have not gotten the vaccine for human papillomavirus (HPV).   - If you had a hysterectomy for a problem that was not cancer or a condition that could lead to cancer, then you no longer need Pap tests. Even if you no longer need a Pap test, a regular exam is a good idea to make sure no other problems are starting.   - If you are between ages 48 and 41 years, and you have had normal Pap tests going back 10 years, you no longer need Pap tests. Even if you no longer need a Pap test, a regular exam is a good idea to make sure no other problems are starting.   - If you have had past treatment for cervical cancer or a condition that  could lead to cancer, you need Pap tests and screening for cancer for at least 20 years after your treatment.   - If Pap tests have been discontinued, risk factors (such as a new sexual partner) need to be reassessed to determine if screening should be resumed.   - The HPV test is an additional test that may be used for cervical cancer screening. The HPV test looks for the virus that can cause the cell changes on the cervix. The cells collected during the Pap test can be tested for HPV. The HPV test could be used to screen women aged 73 years and older, and should be used in women of any age who have unclear Pap test results. After the age of 31, women should have HPV testing at the same frequency as a Pap test.   Colorectal cancer can be detected and often prevented. Most routine colorectal cancer screening begins at the age of 23 years and continues through age 7 years. However, your health care provider may recommend screening at an earlier age if you have risk factors for colon cancer. On a yearly basis, your health care provider may provide home test kits to check for hidden blood in the stool.  Use of a small camera at the end of a tube, to directly examine the colon (sigmoidoscopy or colonoscopy), can detect the earliest forms of colorectal cancer. Talk to your health care provider about this at age 49, when routine screening begins. Direct exam of the colon should be repeated every 5 -10 years through age 42 years, unless early forms of pre-cancerous polyps or small growths are found.   People who are at an increased risk for hepatitis B should be screened for this virus. You are considered at high risk for hepatitis B if:  -You were born in a country where hepatitis B occurs often. Talk with your health care provider about which countries are considered high risk.  - Your parents were born in a high-risk country and you have not received a shot to protect against hepatitis B (hepatitis B  vaccine).  - You have HIV or AIDS.  - You use needles to inject street drugs.  -  You live with, or have sex with, someone who has Hepatitis B.  - You get hemodialysis treatment.  - You take certain medicines for conditions like cancer, organ transplantation, and autoimmune conditions.   Hepatitis C blood testing is recommended for all people born from [birth year] through 1965 and any individual with known risks for hepatitis C.   Practice safe sex. Use condoms and avoid high-risk sexual practices to reduce the spread of sexually transmitted infections (STIs). STIs include gonorrhea, chlamydia, syphilis, trichomonas, herpes, HPV, and human immunodeficiency virus (HIV). Herpes, HIV, and HPV are viral illnesses that have no cure. They can result in disability, cancer, and death. Sexually active women aged 39 years and younger should be checked for chlamydia. Older women with new or multiple partners should also be tested for chlamydia. Testing for other STIs is recommended if you are sexually active and at increased risk.   Osteoporosis is a disease in which the bones lose minerals and strength with aging. This can result in serious bone fractures or breaks. The risk of osteoporosis can be identified using a bone density scan. Women ages 26 years and over and women at risk for fractures or osteoporosis should discuss screening with their health care providers. Ask your health care provider whether you should take a calcium supplement or vitamin D to There are also several preventive steps women can take to avoid osteoporosis and resulting fractures or to keep osteoporosis from worsening. -->Recommendations include:  Eat a balanced diet high in fruits, vegetables, calcium, and vitamins.  Get enough calcium. The recommended total intake of is 1,200 mg daily; for best absorption, if taking supplements, divide doses into 250-500 mg doses throughout the day. Of the two types of calcium, calcium carbonate is best  absorbed when taken with food but calcium citrate can be taken on an empty stomach.  Get enough vitamin D. NAMS and the [place] recommend at least 1,000 IU per day for women age 59 and over who are at risk of vitamin D deficiency. Vitamin D deficiency can be caused by inadequate sun exposure (for example, those who live in [place]).  Avoid alcohol and smoking. Heavy alcohol intake (more than 7 drinks per week) increases the risk of falls and hip fracture and women smokers tend to lose bone more rapidly and have lower bone mass than nonsmokers. Stopping smoking is one of the most important changes women can make to improve their health and decrease risk for disease.  Be physically active every day. Weight-bearing exercise (for example, fast walking, hiking, jogging, and weight training) may strengthen bones or slow the rate of bone loss that comes with aging. Balancing and muscle-strengthening exercises can reduce the risk of falling and fracture.  Consider therapeutic medications. Currently, several types of effective drugs are available. Healthcare providers can recommend the type most appropriate for each woman.  Eliminate environmental factors that may contribute to accidents. Falls cause nearly 90% of all osteoporotic fractures, so reducing this risk is an important bone-health strategy. Measures include ample lighting, removing obstructions to walking, using nonskid rugs on floors, and placing mats and/or grab bars in showers.  Be aware of medication side effects. Some common medicines make bones weaker. These include a type of steroid drug called glucocorticoids used for arthritis and asthma, some antiseizure drugs, certain sleeping pills, treatments for endometriosis, and some cancer drugs. An overactive thyroid gland or using too much thyroid hormone for an underactive thyroid can also be a problem. If you are  taking these medicines, talk to your doctor about  what you can do to help protect your bones.reduce the rate of osteoporosis.    Menopause can be associated with physical symptoms and risks. Hormone replacement therapy is available to decrease symptoms and risks. You should talk to your health care provider about whether hormone replacement therapy is right for you.   Use sunscreen. Apply sunscreen liberally and repeatedly throughout the day. You should seek shade when your shadow is shorter than you. Protect yourself by wearing long sleeves, pants, a wide-brimmed hat, and sunglasses year round, whenever you are outdoors.   Once a month, do a whole body skin exam, using a mirror to look at the skin on your back. Tell your health care provider of new moles, moles that have irregular borders, moles that are larger than a pencil eraser, or moles that have changed in shape or color.   -Stay current with required vaccines (immunizations).   Influenza vaccine. All adults should be immunized every year.  Tetanus, diphtheria, and acellular pertussis (Td, Tdap) vaccine. Pregnant women should receive 1 dose of Tdap vaccine during each pregnancy. The dose should be obtained regardless of the length of time since the last dose. Immunization is preferred during the 27th 36th week of gestation. An adult who has not previously received Tdap or who does not know her vaccine status should receive 1 dose of Tdap. This initial dose should be followed by tetanus and diphtheria toxoids (Td) booster doses every 10 years. Adults with an unknown or incomplete history of completing a 3-dose immunization series with Td-containing vaccines should begin or complete a primary immunization series including a Tdap dose. Adults should receive a Td booster every 10 years.  Varicella vaccine. An adult without evidence of immunity to varicella should receive 2 doses or a second dose if she has previously received 1 dose. Pregnant females who do not have evidence of immunity  should receive the first dose after pregnancy. This first dose should be obtained before leaving the health care facility. The second dose should be obtained 4 8 weeks after the first dose.  Human papillomavirus (HPV) vaccine. Females aged 47 26 years who have not received the vaccine previously should obtain the 3-dose series. The vaccine is not recommended for use in pregnant females. However, pregnancy testing is not needed before receiving a dose. If a female is found to be pregnant after receiving a dose, no treatment is needed. In that case, the remaining doses should be delayed until after the pregnancy. Immunization is recommended for any person with an immunocompromised condition through the age of 35 years if she did not get any or all doses earlier. During the 3-dose series, the second dose should be obtained 4 8 weeks after the first dose. The third dose should be obtained 24 weeks after the first dose and 16 weeks after the second dose.  Zoster vaccine. One dose is recommended for adults aged 66 years or older unless certain conditions are present.  Measles, mumps, and rubella (MMR) vaccine. Adults born before 3 generally are considered immune to measles and mumps. Adults born in [birth year] or later should have 1 or more doses of MMR vaccine unless there is a contraindication to the vaccine or there is laboratory evidence of immunity to each of the three diseases. A routine second dose of MMR vaccine should be obtained at least 28 days after the first dose for students attending postsecondary schools, health care workers, or international travelers. People  who received inactivated measles vaccine or an unknown type of measles vaccine during 1963 1967 should receive 2 doses of MMR vaccine. People who received inactivated mumps vaccine or an unknown type of mumps vaccine before 1979 and are at high risk for mumps infection should consider immunization with 2 doses of MMR vaccine. For females of  childbearing age, rubella immunity should be determined. If there is no evidence of immunity, females who are not pregnant should be vaccinated. If there is no evidence of immunity, females who are pregnant should delay immunization until after pregnancy. Unvaccinated health care workers born before [birth year] who lack laboratory evidence of measles, mumps, or rubella immunity or laboratory confirmation of disease should consider measles and mumps immunization with 2 doses of MMR vaccine or rubella immunization with 1 dose of MMR vaccine.  Pneumococcal 13-valent conjugate (PCV13) vaccine. When indicated, a person who is uncertain of her immunization history and has no record of immunization should receive the PCV13 vaccine. An adult aged 81 years or older who has certain medical conditions and has not been previously immunized should receive 1 dose of PCV13 vaccine. This PCV13 should be followed with a dose of pneumococcal polysaccharide (PPSV23) vaccine. The PPSV23 vaccine dose should be obtained at least 8 weeks after the dose of PCV13 vaccine. An adult aged 2 years or older who has certain medical conditions and previously received 1 or more doses of PPSV23 vaccine should receive 1 dose of PCV13. The PCV13 vaccine dose should be obtained 1 or more years after the last PPSV23 vaccine dose.  Pneumococcal polysaccharide (PPSV23) vaccine. When PCV13 is also indicated, PCV13 should be obtained first. All adults aged 73 years and older should be immunized. An adult younger than age 53 years who has certain medical conditions should be immunized. Any person who resides in a nursing home or long-term care facility should be immunized. An adult smoker should be immunized. People with an immunocompromised condition and certain other conditions should receive both PCV13 and PPSV23 vaccines. People with human immunodeficiency virus (HIV) infection should be immunized as soon as possible after diagnosis. Immunization during  chemotherapy or radiation therapy should be avoided. Routine use of PPSV23 vaccine is not recommended for American Indians, [place] Natives, or people younger than 65 years unless there are medical conditions that require PPSV23 vaccine. When indicated, people who have unknown immunization and have no record of immunization should receive PPSV23 vaccine. One-time revaccination 5 years after the first dose of PPSV23 is recommended for people aged 52 64 years who have chronic kidney failure, nephrotic syndrome, asplenia, or immunocompromised conditions. People who received 1 2 doses of PPSV23 before age 48 years should receive another dose of PPSV23 vaccine at age 48 years or later if at least 5 years have passed since the previous dose. Doses of PPSV23 are not needed for people immunized with PPSV23 at or after age 59 years.  Meningococcal vaccine. Adults with asplenia or persistent complement component deficiencies should receive 2 doses of quadrivalent meningococcal conjugate (MenACWY-D) vaccine. The doses should be obtained at least 2 months apart. Microbiologists working with certain meningococcal bacteria, [employer] recruits, people at risk during an outbreak, and people who travel to or live in countries with a high rate of meningitis should be immunized. A first-year college student up through age 86 years who is living in a residence hall should receive a dose if she did not receive a dose on or after her 16th birthday. Adults who have certain high-risk conditions  should receive one or more doses of vaccine.  Hepatitis A vaccine. Adults who wish to be protected from this disease, have certain high-risk conditions, work with hepatitis A-infected animals, work in hepatitis A research labs, or travel to or work in countries with a high rate of hepatitis A should be immunized. Adults who were previously unvaccinated and who anticipate close contact with an international adoptee during the first 60 days after  arrival in the [place] from a country with a high rate of hepatitis A should be immunized.  Hepatitis B vaccine.  Adults who wish to be protected from this disease, have certain high-risk conditions, may be exposed to blood or other infectious body fluids, are household contacts or sex partners of hepatitis B positive people, are clients or workers in certain care facilities, or travel to or work in countries with a high rate of hepatitis B should be immunized.  Haemophilus influenzae type b (Hib) vaccine. A previously unvaccinated person with asplenia or sickle cell disease or having a scheduled splenectomy should receive 1 dose of Hib vaccine. Regardless of previous immunization, a recipient of a hematopoietic stem cell transplant should receive a 3-dose series 6 12 months after her successful transplant. Hib vaccine is not recommended for adults with HIV infection.  Preventive Services / Frequency Ages 68 to 39years  Blood pressure check.** / Every 1 to 2 years.  Lipid and cholesterol check.** / Every 5 years beginning at age 32.  Clinical breast exam.** / Every 3 years for women in their 39s and 36s.  BRCA-related cancer risk assessment.** / For women who have family members with a BRCA-related cancer (breast, ovarian, tubal, or peritoneal cancers).  Pap test.** / Every 2 years from ages 62 through 55. Every 3 years starting at age 21 through age 48 or 42 with a history of 3 consecutive normal Pap tests.  HPV screening.** / Every 3 years from ages 74 through ages 58 to 68 with a history of 3 consecutive normal Pap tests.  Hepatitis C blood test.** / For any individual with known risks for hepatitis C.  Skin self-exam. / Monthly.  Influenza vaccine. / Every year.  Tetanus, diphtheria, and acellular pertussis (Tdap, Td) vaccine.** / Consult your health care provider. Pregnant women should receive 1 dose of Tdap vaccine during each pregnancy. 1 dose of Td every 10  years.  Varicella vaccine.** / Consult your health care provider. Pregnant females who do not have evidence of immunity should receive the first dose after pregnancy.  HPV vaccine. / 3 doses over 6 months, if 73 and younger. The vaccine is not recommended for use in pregnant females. However, pregnancy testing is not needed before receiving a dose.  Measles, mumps, rubella (MMR) vaccine.** / You need at least 1 dose of MMR if you were born in 1957 or later. You may also need a 2nd dose. For females of childbearing age, rubella immunity should be determined. If there is no evidence of immunity, females who are not pregnant should be vaccinated. If there is no evidence of immunity, females who are pregnant should delay immunization until after pregnancy.  Pneumococcal 13-valent conjugate (PCV13) vaccine.** / Consult your health care provider.  Pneumococcal polysaccharide (PPSV23) vaccine.** / 1 to 2 doses if you smoke cigarettes or if you have certain conditions.  Meningococcal vaccine.** / 1 dose if you are age 51 to 54 years and a Market researcher living in a residence hall, or have one of several medical conditions, you  need to get vaccinated against meningococcal disease. You may also need additional booster doses.  Hepatitis A vaccine.** / Consult your health care provider.  Hepatitis B vaccine.** / Consult your health care provider.  Haemophilus influenzae type b (Hib) vaccine.** / Consult your health care provider.  Ages 40 to 64years  Blood pressure check.** / Every 1 to 2 years.  Lipid and cholesterol check.** / Every 5 years beginning at age 20 years.  Lung cancer screening. / Every year if you are aged 55 80 years and have a 30-pack-year history of smoking and currently smoke or have quit within the past 15 years. Yearly screening is stopped once you have quit smoking for at least 15 years or develop a health problem that would prevent you from having lung cancer  treatment.  Clinical breast exam.** / Every year after age 40 years.  BRCA-related cancer risk assessment.** / For women who have family members with a BRCA-related cancer (breast, ovarian, tubal, or peritoneal cancers).  Mammogram.** / Every year beginning at age 40 years and continuing for as long as you are in good health. Consult with your health care provider.  Pap test.** / Every 3 years starting at age 30 years through age 65 or 70 years with a history of 3 consecutive normal Pap tests.  HPV screening.** / Every 3 years from ages 30 years through ages 65 to 70 years with a history of 3 consecutive normal Pap tests.  Fecal occult blood test (FOBT) of stool. / Every year beginning at age 50 years and continuing until age 75 years. You may not need to do this test if you get a colonoscopy every 10 years.  Flexible sigmoidoscopy or colonoscopy.** / Every 5 years for a flexible sigmoidoscopy or every 10 years for a colonoscopy beginning at age 50 years and continuing until age 75 years.  Hepatitis C blood test.** / For all people born from 1945 through 1965 and any individual with known risks for hepatitis C.  Skin self-exam. / Monthly.  Influenza vaccine. / Every year.  Tetanus, diphtheria, and acellular pertussis (Tdap/Td) vaccine.** / Consult your health care provider. Pregnant women should receive 1 dose of Tdap vaccine during each pregnancy. 1 dose of Td every 10 years.  Varicella vaccine.** / Consult your health care provider. Pregnant females who do not have evidence of immunity should receive the first dose after pregnancy.  Zoster vaccine.** / 1 dose for adults aged 60 years or older.  Measles, mumps, rubella (MMR) vaccine.** / You need at least 1 dose of MMR if you were born in 1957 or later. You may also need a 2nd dose. For females of childbearing age, rubella immunity should be determined. If there is no evidence of immunity, females who are not pregnant should be  vaccinated. If there is no evidence of immunity, females who are pregnant should delay immunization until after pregnancy.  Pneumococcal 13-valent conjugate (PCV13) vaccine.** / Consult your health care provider.  Pneumococcal polysaccharide (PPSV23) vaccine.** / 1 to 2 doses if you smoke cigarettes or if you have certain conditions.  Meningococcal vaccine.** / Consult your health care provider.  Hepatitis A vaccine.** / Consult your health care provider.  Hepatitis B vaccine.** / Consult your health care provider.  Haemophilus influenzae type b (Hib) vaccine.** / Consult your health care provider.  Ages 65 years and over  Blood pressure check.** / Every 1 to 2 years.  Lipid and cholesterol check.** / Every 5 years beginning at age 20   years.  Lung cancer screening. / Every year if you are aged 55 80 years and have a 30-pack-year history of smoking and currently smoke or have quit within the past 15 years. Yearly screening is stopped once you have quit smoking for at least 15 years or develop a health problem that would prevent you from having lung cancer treatment.  Clinical breast exam.** / Every year after age 40 years.  BRCA-related cancer risk assessment.** / For women who have family members with a BRCA-related cancer (breast, ovarian, tubal, or peritoneal cancers).  Mammogram.** / Every year beginning at age 40 years and continuing for as long as you are in good health. Consult with your health care provider.  Pap test.** / Every 3 years starting at age 30 years through age 65 or 70 years with 3 consecutive normal Pap tests. Testing can be stopped between 65 and 70 years with 3 consecutive normal Pap tests and no abnormal Pap or HPV tests in the past 10 years.  HPV screening.** / Every 3 years from ages 30 years through ages 65 or 70 years with a history of 3 consecutive normal Pap tests. Testing can be stopped between 65 and 70 years with 3 consecutive normal Pap tests and no  abnormal Pap or HPV tests in the past 10 years.  Fecal occult blood test (FOBT) of stool. / Every year beginning at age 50 years and continuing until age 75 years. You may not need to do this test if you get a colonoscopy every 10 years.  Flexible sigmoidoscopy or colonoscopy.** / Every 5 years for a flexible sigmoidoscopy or every 10 years for a colonoscopy beginning at age 50 years and continuing until age 75 years.  Hepatitis C blood test.** / For all people born from 1945 through 1965 and any individual with known risks for hepatitis C.  Osteoporosis screening.** / A one-time screening for women ages 65 years and over and women at risk for fractures or osteoporosis.  Skin self-exam. / Monthly.  Influenza vaccine. / Every year.  Tetanus, diphtheria, and acellular pertussis (Tdap/Td) vaccine.** / 1 dose of Td every 10 years.  Varicella vaccine.** / Consult your health care provider.  Zoster vaccine.** / 1 dose for adults aged 60 years or older.  Pneumococcal 13-valent conjugate (PCV13) vaccine.** / Consult your health care provider.  Pneumococcal polysaccharide (PPSV23) vaccine.** / 1 dose for all adults aged 65 years and older.  Meningococcal vaccine.** / Consult your health care provider.  Hepatitis A vaccine.** / Consult your health care provider.  Hepatitis B vaccine.** / Consult your health care provider.  Haemophilus influenzae type b (Hib) vaccine.** / Consult your health care provider. ** Family history and personal history of risk and conditions may change your health care provider's recommendations. Document Released: 12/31/2001 Document Revised: 08/25/2013  [employer] Patient Information 2014 [employer]   EXERCISE AND DIET:  We recommended that you start or continue a regular exercise program for good health. Regular exercise means any activity that makes your heart beat faster and makes you sweat.  We recommend exercising at least 30 minutes per day at least 3  days a week, preferably 5.  We also recommend a diet low in fat and sugar / carbohydrates.  Inactivity, poor dietary choices and obesity can cause diabetes, heart attack, stroke, and kidney damage, among others.     ALCOHOL AND SMOKING:  Women should limit their alcohol intake to no more than 7 drinks/beers/glasses of wine (combined, not each!) per   week. Moderation of alcohol intake to this level decreases your risk of breast cancer and liver damage.  ( And of course, no recreational drugs are part of a healthy lifestyle.)  Also, you should not be smoking at all or even being exposed to second hand smoke. Most people know smoking can cause cancer, and various heart and lung diseases, but did you know it also contributes to weakening of your bones?  Aging of your skin?  Yellowing of your teeth and nails?   CALCIUM AND VITAMIN D:  Adequate intake of calcium and Vitamin D are recommended.  The recommendations for exact amounts of these supplements seem to change often, but generally speaking 600 mg of calcium (either carbonate or citrate) and 800 units of Vitamin D per day seems prudent. Certain women may benefit from higher intake of Vitamin D.  If you are among these women, your doctor will have told you during your visit.     PAP SMEARS:  Pap smears, to check for cervical cancer or precancers,  have traditionally been done yearly, although recent scientific advances have shown that most women can have pap smears less often.  However, every woman still should have a physical exam from her gynecologist or primary care physician every year. It will include a breast check, inspection of the vulva and vagina to check for abnormal growths or skin changes, a visual exam of the cervix, and then an exam to evaluate the size and shape of the uterus and ovaries.  And after 41 years of age, a rectal exam is indicated to check for rectal cancers. We will also provide age appropriate advice regarding health  maintenance, like when you should have certain vaccines, screening for sexually transmitted diseases, bone density testing, colonoscopy, mammograms, etc.    MAMMOGRAMS:  All women over 41 years old should have a yearly mammogram. Many facilities now offer a "3D" mammogram, which may cost around $50 extra out of pocket. If possible,  we recommend you accept the option to have the 3D mammogram performed.  It both reduces the number of women who will be called back for extra views which then turn out to be normal, and it is better than the routine mammogram at detecting truly abnormal areas.     COLONOSCOPY:  Colonoscopy to screen for colon cancer is recommended for all women at age 95.  We know, you hate the idea of the prep.  We agree, BUT, having colon cancer and not knowing it is worse!!  Colon cancer so often starts as a polyp that can be seen and removed at colonscopy, which can quite literally save your life!  And if your first colonoscopy is normal and you have no family history of colon cancer, most women don't have to have it again for 10 years.  Once every ten years, you can do something that may end up saving your life, right?  We will be happy to help you get it scheduled when you are ready.  Be sure to check your insurance coverage so you understand how much it will cost.  It may be covered as a preventative service at no cost, but you should check your particular policy.

## 2020-02-02 ENCOUNTER — Telehealth: Payer: Self-pay

## 2020-02-02 ENCOUNTER — Encounter: Payer: Self-pay | Admitting: Obstetrics and Gynecology

## 2020-02-17 DIAGNOSIS — H1045 Other chronic allergic conjunctivitis: Secondary | ICD-10-CM | POA: Diagnosis not present

## 2020-02-17 HISTORY — PX: BREAST BIOPSY: SHX20

## 2020-02-18 ENCOUNTER — Ambulatory Visit: Payer: BC Managed Care – PPO

## 2020-02-25 ENCOUNTER — Other Ambulatory Visit: Payer: Self-pay

## 2020-02-28 ENCOUNTER — Other Ambulatory Visit (HOSPITAL_COMMUNITY)
Admission: RE | Admit: 2020-02-28 | Discharge: 2020-02-28 | Disposition: A | Discharge status: Home / Self Care | Payer: BC Managed Care – PPO | Source: Ambulatory Visit | Attending: Obstetrics and Gynecology | Admitting: Obstetrics and Gynecology

## 2020-02-28 ENCOUNTER — Other Ambulatory Visit: Payer: Self-pay

## 2020-02-28 ENCOUNTER — Ambulatory Visit (INDEPENDENT_AMBULATORY_CARE_PROVIDER_SITE_OTHER): Payer: BC Managed Care – PPO | Admitting: Obstetrics and Gynecology

## 2020-02-28 ENCOUNTER — Encounter: Payer: Self-pay | Admitting: Obstetrics and Gynecology

## 2020-02-28 VITALS — BP 136/78 | HR 82 | Temp 97.7°F | Ht 68.0 in | Wt 185.0 lb

## 2020-02-28 DIAGNOSIS — E559 Vitamin D deficiency, unspecified: Secondary | ICD-10-CM | POA: Diagnosis not present

## 2020-02-28 DIAGNOSIS — Z124 Encounter for screening for malignant neoplasm of cervix: Secondary | ICD-10-CM

## 2020-02-28 DIAGNOSIS — Z01419 Encounter for gynecological examination (general) (routine) without abnormal findings: Secondary | ICD-10-CM | POA: Diagnosis not present

## 2020-02-28 DIAGNOSIS — N920 Excessive and frequent menstruation with regular cycle: Secondary | ICD-10-CM

## 2020-02-28 DIAGNOSIS — R61 Generalized hyperhidrosis: Secondary | ICD-10-CM

## 2020-02-28 DIAGNOSIS — R946 Abnormal results of thyroid function studies: Secondary | ICD-10-CM

## 2020-02-28 DIAGNOSIS — E781 Pure hyperglyceridemia: Secondary | ICD-10-CM

## 2020-02-28 DIAGNOSIS — E7849 Other hyperlipidemia: Secondary | ICD-10-CM

## 2020-02-28 NOTE — Progress Notes (Signed)
39 y.o. [id number] Married White or Caucasian Not Hispanic or Latino female here for annual exam. Patient states that she wakes up soaked in sweat. She also states that she has no sex drive.  She c/o night sweats, they started over a year ago. Occurs at least 20 x in a month. No hot flashes. Overall still sleeping okay. Some mild PMS, irritable for a day or two prior to her cycle.   Cycles are getting closer and heavier. She can saturate an ultra tampon in 2-3 hours. Not anemic in 8/20.  Cramps have worsened. She takes ibuprofen which helps her cramps. Period Cycle (Days): 25 Period Duration (Days): 7 Period Pattern: Regular Menstrual Flow: Heavy Menstrual Control: Tampon, Maxi pad Menstrual Control Change Freq (Hours): 2 Dysmenorrhea: (!) Moderate Dysmenorrhea Symptoms: Cramping   She had abnormal lipid panel in 8/20 (not fasting). She has lost 20 lbs since then and is following up with    Patient's last menstrual period was 02/11/2020.          Sexually active: Yes.    The current method of family planning is vasectomy.    Exercising: No.  The patient does not participate in regular exercise at present. Smoker:  no  Health Maintenance: Pap:  02/24/2014 normal  History of abnormal Pap:  no MMG:  04/25/14 normal, has one tomorrow.   Colonoscopy: yes normal unsure of date sees Dr. Collene Mares  TDaP:  09/03/19 Gardasil: no   reports that she has never smoked. She has never used smokeless tobacco. She reports current alcohol use of about 12.0 standard drinks of alcohol per week. She reports that she does not use drugs. Works as a Hospital doctor, working at home. Kids are 9 and 11.   Past Medical History:  Diagnosis Date  . Anxiety   . Celiac disease   . IBS (irritable bowel syndrome)   . Lipids abnormal   . Migraine   . Mononucleosis   . Paroxysmal SVT (supraventricular tachycardia) ([place])   . Tachycardia   . TMJ disease   . Urinary incontinence    after sneezing   H/O SVT-AFib  spontaneously changed to SR. On medication  Past Surgical History:  Procedure Laterality Date  . ABDOMINOPLASTY    . CESAREAN SECTION    . LAPAROSCOPY    . TOOTH EXTRACTION      Current Outpatient Medications  Medication Sig Dispense Refill  . atenolol (TENORMIN) 25 MG tablet TAKE 1 TABLET BY MOUTH DAILY 90 tablet 2  . busPIRone (BUSPAR) 5 MG tablet 0.5 tab in am and 1 tab q hs 135 tablet 0  . loratadine (CLARITIN) 10 MG tablet Take 10 mg by mouth daily as needed for allergies.    . Multiple Vitamin (MULTIVITAMIN) tablet Take 1 tablet by mouth daily.    . sertraline (ZOLOFT) 100 MG tablet Take 1 tablet (100 mg total) by mouth daily. 90 tablet 1  . Vitamin D, Ergocalciferol, (DRISDOL) 1.25 MG (50000 UT) CAPS capsule Take one tablet wkly 12 capsule 3   No current facility-administered medications for this visit.    Family History  Problem Relation Age of Onset  . Hypertension Other   . Aneurysm Maternal Grandmother   . Prostate cancer Maternal Grandfather   . Heart attack Paternal Grandmother   . Colon cancer Neg Hx   . Stomach cancer Neg Hx   . Rectal cancer Neg Hx   . Esophageal cancer Neg Hx   . Liver cancer Neg Hx  Review of Systems  Constitutional: Negative.   HENT: Negative.   Eyes: Negative.   Respiratory: Negative.   Cardiovascular: Negative.   Gastrointestinal: Negative.   Endocrine: Negative.   Genitourinary: Negative.   Musculoskeletal: Negative.   Skin: Negative.   Allergic/Immunologic: Negative.   Neurological: Negative.   Hematological: Negative.   Psychiatric/Behavioral: The patient is nervous/anxious.   Tolerable GSI, knows how to do kegels  Exam:   BP 136/78   Pulse 82   Temp 97.7 F (36.5 C)   Ht 5' 8"  (1.727 m)   Wt 185 lb (83.9 kg)   LMP 02/11/2020   SpO2 98%   BMI 28.13 kg/m   Weight change: @WEIGHTCHANGE @ Height:   Height: 5' 8"  (172.7 cm)  Ht Readings from Last 3 Encounters:  02/28/20 5' 8"  (1.727 m)  02/01/20 5' 9"  (1.753 m)   12/23/19 5' 9"  (1.753 m)    General appearance: alert, cooperative and appears stated age Head: Normocephalic, without obvious abnormality, atraumatic Neck: no adenopathy, supple, symmetrical, trachea midline and thyroid normal to inspection and palpation Lungs: clear to auscultation bilaterally Cardiovascular: regular rate and rhythm Breasts: normal appearance, no masses or tenderness Abdomen: soft, non-tender; non distended,  no masses,  no organomegaly Extremities: extremities normal, atraumatic, no cyanosis or edema Skin: Skin color, texture, turgor normal. No rashes or lesions Lymph nodes: Cervical, supraclavicular, and axillary nodes normal. No abnormal inguinal nodes palpated Neurologic: Grossly normal   Pelvic: External genitalia:  no lesions              Urethra:  normal appearing urethra with no masses, tenderness or lesions              Bartholins and Skenes: normal                 Vagina: normal appearing vagina with normal color and discharge, no lesions              Cervix: no lesions               Bimanual Exam:  Uterus:  normal size, contour, position, consistency, mobility, non-tender and retroverted              Adnexa: no mass, fullness, tenderness               Rectovaginal: Confirms               Anus:  normal sphincter tone, no lesions  Marisa Sprinkles chaperoned for the exam.  A:  Well Woman with normal exam  Abnormal lipids, elevated triglycerides  Night sweats  Heavy cycles  Dysmenorrhea  Vit d def    P:   Pap with hpv  Discussed breast self exam  Discussed calcium and vit D intake  Try estroven pm  Return for fasting labs: CBC, TFT's, Lipid panel, Vit D, FSH

## 2020-02-28 NOTE — Patient Instructions (Signed)
Try estroven pm for the night sweats.  EXERCISE AND DIET:  We recommended that you start or continue a regular exercise program for good health. Regular exercise means any activity that makes your heart beat faster and makes you sweat.  We recommend exercising at least 30 minutes per day at least 3 days a week, preferably 4 or 5.  We also recommend a diet low in fat and sugar.  Inactivity, poor dietary choices and obesity can cause diabetes, heart attack, stroke, and kidney damage, among others.    ALCOHOL AND SMOKING:  Women should limit their alcohol intake to no more than 7 drinks/beers/glasses of wine (combined, not each!) per week. Moderation of alcohol intake to this level decreases your risk of breast cancer and liver damage. And of course, no recreational drugs are part of a healthy lifestyle.  And absolutely no smoking or even second hand smoke. Most people know smoking can cause heart and lung diseases, but did you know it also contributes to weakening of your bones? Aging of your skin?  Yellowing of your teeth and nails?  CALCIUM AND VITAMIN D:  Adequate intake of calcium and Vitamin D are recommended.  The recommendations for exact amounts of these supplements seem to change often, but generally speaking 1,000 mg of calcium (between diet and supplement) and 800 units of Vitamin D per day seems prudent. Certain women may benefit from higher intake of Vitamin D.  If you are among these women, your doctor will have told you during your visit.    PAP SMEARS:  Pap smears, to check for cervical cancer or precancers,  have traditionally been done yearly, although recent scientific advances have shown that most women can have pap smears less often.  However, every woman still should have a physical exam from her gynecologist every year. It will include a breast check, inspection of the vulva and vagina to check for abnormal growths or skin changes, a visual exam of the cervix, and then an exam to  evaluate the size and shape of the uterus and ovaries.  And after 41 years of age, a rectal exam is indicated to check for rectal cancers. We will also provide age appropriate advice regarding health maintenance, like when you should have certain vaccines, screening for sexually transmitted diseases, bone density testing, colonoscopy, mammograms, etc.   MAMMOGRAMS:  All women over 41 years old should have a yearly mammogram. Many facilities now offer a "3D" mammogram, which may cost around $50 extra out of pocket. If possible,  we recommend you accept the option to have the 3D mammogram performed.  It both reduces the number of women who will be called back for extra views which then turn out to be normal, and it is better than the routine mammogram at detecting truly abnormal areas.    COLON CANCER SCREENING: Now recommend starting at age 41. At this time colonoscopy is not covered for routine screening until 50. There are take home tests that can be done between 45-49.   COLONOSCOPY:  Colonoscopy to screen for colon cancer is recommended for all women at age 41.  We know, you hate the idea of the prep.  We agree, BUT, having colon cancer and not knowing it is worse!!  Colon cancer so often starts as a polyp that can be seen and removed at colonscopy, which can quite literally save your life!  And if your first colonoscopy is normal and you have no family history of colon cancer, most  women don't have to have it again for 10 years.  Once every ten years, you can do something that may end up saving your life, right?  We will be happy to help you get it scheduled when you are ready.  Be sure to check your insurance coverage so you understand how much it will cost.  It may be covered as a preventative service at no cost, but you should check your particular policy.      Breast Self-Awareness Breast self-awareness means being familiar with how your breasts look and feel. It involves checking your breasts  regularly and reporting any changes to your health care provider. Practicing breast self-awareness is important. A change in your breasts can be a sign of a serious medical problem. Being familiar with how your breasts look and feel allows you to find any problems early, when treatment is more likely to be successful. All women should practice breast self-awareness, including women who have had breast implants. How to do a breast self-exam One way to learn what is normal for your breasts and whether your breasts are changing is to do a breast self-exam. To do a breast self-exam: Look for Changes  1. Remove all the clothing above your waist. 2. Stand in front of a mirror in a room with good lighting. 3. Put your hands on your hips. 4. Push your hands firmly downward. 5. Compare your breasts in the mirror. Look for differences between them (asymmetry), such as: ? Differences in shape. ? Differences in size. ? Puckers, dips, and bumps in one breast and not the other. 6. Look at each breast for changes in your skin, such as: ? Redness. ? Scaly areas. 7. Look for changes in your nipples, such as: ? Discharge. ? Bleeding. ? Dimpling. ? Redness. ? A change in position. Feel for Changes Carefully feel your breasts for lumps and changes. It is best to do this while lying on your back on the floor and again while sitting or standing in the shower or tub with soapy water on your skin. Feel each breast in the following way:  Place the arm on the side of the breast you are examining above your head.  Feel your breast with the other hand.  Start in the nipple area and make  inch (2 cm) overlapping circles to feel your breast. Use the pads of your three middle fingers to do this. Apply light pressure, then medium pressure, then firm pressure. The light pressure will allow you to feel the tissue closest to the skin. The medium pressure will allow you to feel the tissue that is a little deeper. The  firm pressure will allow you to feel the tissue close to the ribs.  Continue the overlapping circles, moving downward over the breast until you feel your ribs below your breast.  Move one finger-width toward the center of the body. Continue to use the  inch (2 cm) overlapping circles to feel your breast as you move slowly up toward your collarbone.  Continue the up and down exam using all three pressures until you reach your armpit.  Write Down What You Find  Write down what is normal for each breast and any changes that you find. Keep a written record with breast changes or normal findings for each breast. By writing this information down, you do not need to depend only on memory for size, tenderness, or location. Write down where you are in your menstrual cycle, if you are still  menstruating. If you are having trouble noticing differences in your breasts, do not get discouraged. With time you will become more familiar with the variations in your breasts and more comfortable with the exam. How often should I examine my breasts? Examine your breasts every month. If you are breastfeeding, the best time to examine your breasts is after a feeding or after using a breast pump. If you menstruate, the best time to examine your breasts is 5-7 days after your period is over. During your period, your breasts are lumpier, and it may be more difficult to notice changes. When should I see my health care provider? See your health care provider if you notice:  A change in shape or size of your breasts or nipples.  A change in the skin of your breast or nipples, such as a reddened or scaly area.  Unusual discharge from your nipples.  A lump or thick area that was not there before.  Pain in your breasts.  Anything that concerns you.  Kegel Exercises  Kegel exercises can help strengthen your pelvic floor muscles. The pelvic floor is a group of muscles that support your rectum, small intestine, and  bladder. In females, pelvic floor muscles also help support the womb (uterus). These muscles help you control the flow of urine and stool. Kegel exercises are painless and simple, and they do not require any equipment. Your provider may suggest Kegel exercises to:  Improve bladder and bowel control.  Improve sexual response.  Improve weak pelvic floor muscles after surgery to remove the uterus (hysterectomy) or pregnancy (females).  Improve weak pelvic floor muscles after prostate gland removal or surgery (males). Kegel exercises involve squeezing your pelvic floor muscles, which are the same muscles you squeeze when you try to stop the flow of urine or keep from passing gas. The exercises can be done while sitting, standing, or lying down, but it is best to vary your position. Exercises How to do Kegel exercises: 1. Squeeze your pelvic floor muscles tight. You should feel a tight lift in your rectal area. If you are a female, you should also feel a tightness in your vaginal area. Keep your stomach, buttocks, and legs relaxed. 2. Hold the muscles tight for up to 10 seconds. 3. Breathe normally. 4. Relax your muscles. 5. Repeat as told by your health care provider. Repeat this exercise daily as told by your health care provider. Continue to do this exercise for at least 4-6 weeks, or for as long as told by your health care provider. You may be referred to a physical therapist who can help you learn more about how to do Kegel exercises. Depending on your condition, your health care provider may recommend:  Varying how long you squeeze your muscles.  Doing several sets of exercises every day.  Doing exercises for several weeks.  Making Kegel exercises a part of your regular exercise routine. This information is not intended to replace advice given to you by your health care provider. Make sure you discuss any questions you have with your health care provider. Document Revised: 06/24/2018  Document Reviewed: 06/24/2018 Elsevier Patient Education  [employer].

## 2020-02-29 ENCOUNTER — Ambulatory Visit
Admission: RE | Admit: 2020-02-29 | Discharge: 2020-02-29 | Disposition: A | Discharge status: Home / Self Care | Payer: BC Managed Care – PPO | Source: Ambulatory Visit | Attending: Family Medicine | Admitting: Family Medicine

## 2020-02-29 DIAGNOSIS — Z1231 Encounter for screening mammogram for malignant neoplasm of breast: Secondary | ICD-10-CM | POA: Diagnosis not present

## 2020-02-29 LAB — CYTOLOGY - PAP
Comment: NEGATIVE
Diagnosis: NEGATIVE
High risk HPV: NEGATIVE

## 2020-03-02 ENCOUNTER — Other Ambulatory Visit: Payer: BC Managed Care – PPO

## 2020-03-02 ENCOUNTER — Other Ambulatory Visit: Payer: Self-pay

## 2020-03-02 ENCOUNTER — Other Ambulatory Visit: Payer: Self-pay | Admitting: Family Medicine

## 2020-03-02 DIAGNOSIS — R928 Other abnormal and inconclusive findings on diagnostic imaging of breast: Secondary | ICD-10-CM

## 2020-03-02 NOTE — Progress Notes (Signed)
Opened in error.  Charyl Bigger, CMA

## 2020-03-09 ENCOUNTER — Other Ambulatory Visit: Payer: Self-pay

## 2020-03-09 ENCOUNTER — Ambulatory Visit
Admission: RE | Admit: 2020-03-09 | Discharge: 2020-03-09 | Disposition: A | Discharge status: Home / Self Care | Payer: BC Managed Care – PPO | Source: Ambulatory Visit | Attending: Family Medicine | Admitting: Family Medicine

## 2020-03-09 ENCOUNTER — Other Ambulatory Visit: Payer: Self-pay | Admitting: Family Medicine

## 2020-03-09 ENCOUNTER — Ambulatory Visit: Payer: BC Managed Care – PPO

## 2020-03-09 DIAGNOSIS — R928 Other abnormal and inconclusive findings on diagnostic imaging of breast: Secondary | ICD-10-CM

## 2020-03-09 DIAGNOSIS — R922 Inconclusive mammogram: Secondary | ICD-10-CM | POA: Diagnosis not present

## 2020-03-09 DIAGNOSIS — N6489 Other specified disorders of breast: Secondary | ICD-10-CM | POA: Diagnosis not present

## 2020-03-12 ENCOUNTER — Other Ambulatory Visit: Payer: Self-pay | Admitting: Internal Medicine

## 2020-03-13 NOTE — Telephone Encounter (Signed)
Opened in error

## 2020-03-14 ENCOUNTER — Other Ambulatory Visit (INDEPENDENT_AMBULATORY_CARE_PROVIDER_SITE_OTHER): Payer: BC Managed Care – PPO

## 2020-03-14 ENCOUNTER — Other Ambulatory Visit: Payer: Self-pay

## 2020-03-14 DIAGNOSIS — E7849 Other hyperlipidemia: Secondary | ICD-10-CM

## 2020-03-14 DIAGNOSIS — R61 Generalized hyperhidrosis: Secondary | ICD-10-CM | POA: Diagnosis not present

## 2020-03-14 DIAGNOSIS — R946 Abnormal results of thyroid function studies: Secondary | ICD-10-CM | POA: Diagnosis not present

## 2020-03-14 DIAGNOSIS — N920 Excessive and frequent menstruation with regular cycle: Secondary | ICD-10-CM

## 2020-03-14 DIAGNOSIS — E559 Vitamin D deficiency, unspecified: Secondary | ICD-10-CM | POA: Diagnosis not present

## 2020-03-14 DIAGNOSIS — E781 Pure hyperglyceridemia: Secondary | ICD-10-CM | POA: Diagnosis not present

## 2020-03-15 ENCOUNTER — Telehealth: Payer: Self-pay

## 2020-03-15 ENCOUNTER — Encounter: Payer: Self-pay | Admitting: Obstetrics and Gynecology

## 2020-03-15 LAB — THYROID PANEL WITH TSH
Free Thyroxine Index: 1.8 (ref 1.2–4.9)
T3 Uptake Ratio: 28 % (ref 24–39)
T4, Total: 6.3 ug/dL (ref 4.5–12.0)
TSH: 1.81 u[IU]/mL (ref 0.450–4.500)

## 2020-03-15 LAB — CBC
Hematocrit: 41.5 % (ref 34.0–46.6)
Hemoglobin: 14.2 g/dL (ref 11.1–15.9)
MCH: 31.8 pg (ref 26.6–33.0)
MCHC: 34.2 g/dL (ref 31.5–35.7)
MCV: 93 fL (ref 79–97)
Platelets: 287 10*3/uL (ref 150–450)
RBC: 4.47 x10E6/uL (ref 3.77–5.28)
RDW: 12.2 % (ref 11.7–15.4)
WBC: 4.4 10*3/uL (ref 3.4–10.8)

## 2020-03-15 LAB — LIPID PANEL
Chol/HDL Ratio: 4.5 ratio — ABNORMAL HIGH (ref 0.0–4.4)
Cholesterol, Total: 203 mg/dL — ABNORMAL HIGH (ref 100–199)
HDL: 45 mg/dL (ref >39)
LDL Chol Calc (NIH): 115 mg/dL — ABNORMAL HIGH (ref 0–99)
Triglycerides: 245 mg/dL — ABNORMAL HIGH (ref 0–149)
VLDL Cholesterol Cal: 43 mg/dL — ABNORMAL HIGH (ref 5–40)

## 2020-03-15 LAB — FOLLICLE STIMULATING HORMONE: FSH: 5.7 m[IU]/mL

## 2020-03-15 LAB — VITAMIN D 25 HYDROXY (VIT D DEFICIENCY, FRACTURES): Vit D, 25-Hydroxy: 49.7 ng/mL (ref 30.0–100.0)

## 2020-03-15 NOTE — Telephone Encounter (Signed)
Pt sent following Mychart message:   Non-Urgent Medical Question  Meyer, Dockery Jama Flavors, MD 5 minutes ago (4:55 PM)  AL Has my lipid panel improved enough that I can wait until my yearly exam with my cardiologist in the fall?

## 2020-03-15 NOTE — Telephone Encounter (Signed)
Left message for pt to return call to office and speak with any triage RN.    Salvadore Dom, MD  03/15/2020 11:36 AM EDT    Please let the patient know that her lipid panel is much better than last year, but still abnormal. Her primary is leaving and I think she is seeing her Cardiologist to manage her cardiac medication. Please send a copy of her labs to her Cardiologist, she needs to see him to discuss if treatment is needed.  The rest of her lab work is normal.

## 2020-03-16 ENCOUNTER — Encounter: Payer: Self-pay | Admitting: Family Medicine

## 2020-03-16 NOTE — Telephone Encounter (Signed)
Spoke to pt. Pt given lab results and recommendations per Dr Talbert Nan. Pt verbalized understanding. Pt to call Cardiologist to follow up with lipid panel results. Lab results faxed per Eustace Pen, CMA to Cardiologist office. Pt agreeable.   Routing to Dr Talbert Nan for review.  Encounter closed.

## 2020-03-16 NOTE — Telephone Encounter (Signed)
Left message for pt to return call to [place], [place] in triage.

## 2020-03-16 NOTE — Telephone Encounter (Signed)
Patient returned call

## 2020-03-17 ENCOUNTER — Other Ambulatory Visit: Payer: Self-pay

## 2020-03-17 ENCOUNTER — Ambulatory Visit
Admission: RE | Admit: 2020-03-17 | Discharge: 2020-03-17 | Disposition: A | Discharge status: Home / Self Care | Payer: BC Managed Care – PPO | Source: Ambulatory Visit | Attending: Family Medicine | Admitting: Family Medicine

## 2020-03-17 DIAGNOSIS — N6311 Unspecified lump in the right breast, upper outer quadrant: Secondary | ICD-10-CM | POA: Diagnosis not present

## 2020-03-17 DIAGNOSIS — R928 Other abnormal and inconclusive findings on diagnostic imaging of breast: Secondary | ICD-10-CM

## 2020-03-17 DIAGNOSIS — D241 Benign neoplasm of right breast: Secondary | ICD-10-CM | POA: Diagnosis not present

## 2020-03-23 ENCOUNTER — Other Ambulatory Visit: Payer: BC Managed Care – PPO

## 2020-03-24 ENCOUNTER — Other Ambulatory Visit: Payer: Self-pay | Admitting: Family Medicine

## 2020-03-24 DIAGNOSIS — F411 Generalized anxiety disorder: Secondary | ICD-10-CM

## 2020-03-24 DIAGNOSIS — F410 Panic disorder [episodic paroxysmal anxiety] without agoraphobia: Secondary | ICD-10-CM

## 2020-03-24 MED ORDER — BUSPIRONE HCL 5 MG PO TABS: ORAL_TABLET | ORAL | 0 refills | Status: DC

## 2020-03-24 MED ORDER — ATENOLOL 25 MG PO TABS: 25.0000 mg | ORAL_TABLET | Freq: Every day | ORAL | 1 refills | Status: DC

## 2020-03-24 NOTE — Telephone Encounter (Signed)
Sent to [employer] in refills for assistance.

## 2020-06-01 ENCOUNTER — Other Ambulatory Visit: Payer: Self-pay

## 2020-06-01 DIAGNOSIS — E559 Vitamin D deficiency, unspecified: Secondary | ICD-10-CM

## 2020-06-01 MED ORDER — VITAMIN D (ERGOCALCIFEROL) 1.25 MG (50000 UNIT) PO CAPS: ORAL_CAPSULE | ORAL | 0 refills | Status: DC

## 2020-06-06 ENCOUNTER — Other Ambulatory Visit: Payer: Self-pay | Admitting: Physician Assistant

## 2020-06-06 DIAGNOSIS — F411 Generalized anxiety disorder: Secondary | ICD-10-CM

## 2020-06-06 MED ORDER — SERTRALINE HCL 100 MG PO TABS: 100.0000 mg | ORAL_TABLET | Freq: Every day | ORAL | 0 refills | Status: DC

## 2020-06-10 ENCOUNTER — Other Ambulatory Visit: Payer: Self-pay | Admitting: Physician Assistant

## 2020-06-10 DIAGNOSIS — F410 Panic disorder [episodic paroxysmal anxiety] without agoraphobia: Secondary | ICD-10-CM

## 2020-06-10 DIAGNOSIS — F411 Generalized anxiety disorder: Secondary | ICD-10-CM

## 2020-07-03 ENCOUNTER — Other Ambulatory Visit: Payer: Self-pay | Admitting: Physician Assistant

## 2020-07-03 DIAGNOSIS — Z8639 Personal history of other endocrine, nutritional and metabolic disease: Secondary | ICD-10-CM

## 2020-07-03 DIAGNOSIS — E781 Pure hyperglyceridemia: Secondary | ICD-10-CM

## 2020-07-03 DIAGNOSIS — R946 Abnormal results of thyroid function studies: Secondary | ICD-10-CM

## 2020-07-04 ENCOUNTER — Other Ambulatory Visit: Payer: Self-pay

## 2020-07-04 ENCOUNTER — Other Ambulatory Visit: Payer: BC Managed Care – PPO

## 2020-07-04 DIAGNOSIS — R946 Abnormal results of thyroid function studies: Secondary | ICD-10-CM | POA: Diagnosis not present

## 2020-07-04 DIAGNOSIS — Z8639 Personal history of other endocrine, nutritional and metabolic disease: Secondary | ICD-10-CM | POA: Diagnosis not present

## 2020-07-04 DIAGNOSIS — E781 Pure hyperglyceridemia: Secondary | ICD-10-CM

## 2020-07-05 LAB — COMPREHENSIVE METABOLIC PANEL
ALT: 33 IU/L — ABNORMAL HIGH (ref 0–32)
AST: 24 IU/L (ref 0–40)
Albumin/Globulin Ratio: 1.5 (ref 1.2–2.2)
Albumin: 4.3 g/dL (ref 3.8–4.8)
Alkaline Phosphatase: 93 IU/L (ref 48–121)
BUN/Creatinine Ratio: 17 (ref 9–23)
BUN: 11 mg/dL (ref 6–24)
Bilirubin Total: 0.3 mg/dL (ref 0.0–1.2)
CO2: 22 mmol/L (ref 20–29)
Calcium: 9.5 mg/dL (ref 8.7–10.2)
Chloride: 102 mmol/L (ref 96–106)
Creatinine, Ser: 0.64 mg/dL (ref 0.57–1.00)
GFR calc Af Amer: 128 mL/min/{1.73_m2} (ref >59)
GFR calc non Af Amer: 111 mL/min/{1.73_m2} (ref >59)
Globulin, Total: 2.8 g/dL (ref 1.5–4.5)
Glucose: 78 mg/dL (ref 65–99)
Potassium: 4.2 mmol/L (ref 3.5–5.2)
Sodium: 139 mmol/L (ref 134–144)
Total Protein: 7.1 g/dL (ref 6.0–8.5)

## 2020-07-05 LAB — HEMOGLOBIN A1C
Est. average glucose Bld gHb Est-mCnc: 100 mg/dL
Hgb A1c MFr Bld: 5.1 % (ref 4.8–5.6)

## 2020-07-05 LAB — LIPID PANEL
Chol/HDL Ratio: 5.2 ratio — ABNORMAL HIGH (ref 0.0–4.4)
Cholesterol, Total: 280 mg/dL — ABNORMAL HIGH (ref 100–199)
HDL: 54 mg/dL (ref >39)
LDL Chol Calc (NIH): 152 mg/dL — ABNORMAL HIGH (ref 0–99)
Triglycerides: 394 mg/dL — ABNORMAL HIGH (ref 0–149)
VLDL Cholesterol Cal: 74 mg/dL — ABNORMAL HIGH (ref 5–40)

## 2020-07-05 LAB — TSH: TSH: 2.11 u[IU]/mL (ref 0.450–4.500)

## 2020-07-05 LAB — CBC
Hematocrit: 39.2 % (ref 34.0–46.6)
Hemoglobin: 13.7 g/dL (ref 11.1–15.9)
MCH: 32.4 pg (ref 26.6–33.0)
MCHC: 34.9 g/dL (ref 31.5–35.7)
MCV: 93 fL (ref 79–97)
Platelets: 246 10*3/uL (ref 150–450)
RBC: 4.23 x10E6/uL (ref 3.77–5.28)
RDW: 12.8 % (ref 11.7–15.4)
WBC: 6 10*3/uL (ref 3.4–10.8)

## 2020-07-13 DIAGNOSIS — Z20828 Contact with and (suspected) exposure to other viral communicable diseases: Secondary | ICD-10-CM | POA: Diagnosis not present

## 2020-08-01 ENCOUNTER — Other Ambulatory Visit: Payer: Self-pay | Admitting: Physician Assistant

## 2020-08-01 DIAGNOSIS — E559 Vitamin D deficiency, unspecified: Secondary | ICD-10-CM

## 2020-08-03 ENCOUNTER — Ambulatory Visit (INDEPENDENT_AMBULATORY_CARE_PROVIDER_SITE_OTHER): Payer: BC Managed Care – PPO | Admitting: Physician Assistant

## 2020-08-03 ENCOUNTER — Encounter: Payer: Self-pay | Admitting: Physician Assistant

## 2020-08-03 ENCOUNTER — Other Ambulatory Visit: Payer: Self-pay

## 2020-08-03 VITALS — BP 118/77 | HR 93 | Ht 68.0 in | Wt 199.2 lb

## 2020-08-03 DIAGNOSIS — E785 Hyperlipidemia, unspecified: Secondary | ICD-10-CM

## 2020-08-03 DIAGNOSIS — F410 Panic disorder [episodic paroxysmal anxiety] without agoraphobia: Secondary | ICD-10-CM

## 2020-08-03 DIAGNOSIS — F411 Generalized anxiety disorder: Secondary | ICD-10-CM

## 2020-08-03 DIAGNOSIS — R748 Abnormal levels of other serum enzymes: Secondary | ICD-10-CM

## 2020-08-03 DIAGNOSIS — E781 Pure hyperglyceridemia: Secondary | ICD-10-CM

## 2020-08-03 NOTE — Patient Instructions (Addendum)
Heart-Healthy Eating Plan Heart-healthy meal planning includes:  Eating less unhealthy fats.  Eating more healthy fats.  Making other changes in your diet. Talk with your doctor or a diet specialist (dietitian) to create an eating plan that is right for you. What is my plan? Your doctor may recommend an eating plan that includes:  Total fat: ______% or less of total calories a day.  Saturated fat: ______% or less of total calories a day.  Cholesterol: less than _________mg a day. What are tips for following this plan? Cooking Avoid frying your food. Try to bake, boil, grill, or broil it instead. You can also reduce fat by:  Removing the skin from poultry.  Removing all visible fats from meats.  Steaming vegetables in water or broth. Meal planning   At meals, divide your plate into four equal parts: ? Fill one-half of your plate with vegetables and green salads. ? Fill one-fourth of your plate with whole grains. ? Fill one-fourth of your plate with lean protein foods.  Eat 4-5 servings of vegetables per day. A serving of vegetables is: ? 1 cup of raw or cooked vegetables. ? 2 cups of raw leafy greens.  Eat 4-5 servings of fruit per day. A serving of fruit is: ? 1 medium whole fruit. ?  cup of dried fruit. ?  cup of fresh, frozen, or canned fruit. ?  cup of 100% fruit juice.  Eat more foods that have soluble fiber. These are apples, broccoli, carrots, beans, peas, and barley. Try to get 20-30 g of fiber per day.  Eat 4-5 servings of nuts, legumes, and seeds per week: ? 1 serving of dried beans or legumes equals  cup after being cooked. ? 1 serving of nuts is  cup. ? 1 serving of seeds equals 1 tablespoon. General information  Eat more home-cooked food. Eat less restaurant, buffet, and fast food.  Limit or avoid alcohol.  Limit foods that are high in starch and sugar.  Avoid fried foods.  Lose weight if you are overweight.  Keep track of how much  salt (sodium) you eat. This is important if you have high blood pressure. Ask your doctor to tell you more about this.  Try to add vegetarian meals each week. Fats  Choose healthy fats. These include olive oil and canola oil, flaxseeds, walnuts, almonds, and seeds.  Eat more omega-3 fats. These include salmon, mackerel, sardines, tuna, flaxseed oil, and ground flaxseeds. Try to eat fish at least 2 times each week.  Check food labels. Avoid foods with trans fats or high amounts of saturated fat.  Limit saturated fats. ? These are often found in animal products, such as meats, butter, and cream. ? These are also found in plant foods, such as palm oil, palm kernel oil, and coconut oil.  Avoid foods with partially hydrogenated oils in them. These have trans fats. Examples are stick margarine, some tub margarines, cookies, crackers, and other baked goods. What foods can I eat? Fruits All fresh, canned (in natural juice), or frozen fruits. Vegetables Fresh or frozen vegetables (raw, steamed, roasted, or grilled). Green salads. Grains Most grains. Choose whole wheat and whole grains most of the time. Rice and pasta, including brown rice and pastas made with whole wheat. Meats and other proteins Lean, well-trimmed beef, veal, pork, and lamb. Chicken and [place] without skin. All fish and shellfish. Wild duck, rabbit, pheasant, and venison. Egg whites or low-cholesterol egg substitutes. Dried beans, peas, lentils, and tofu. Seeds and  most nuts. Dairy Low-fat or nonfat cheeses, including ricotta and mozzarella. Skim or 1% milk that is liquid, powdered, or evaporated. Buttermilk that is made with low-fat milk. Nonfat or low-fat yogurt. Fats and oils Non-hydrogenated (trans-free) margarines. Vegetable oils, including soybean, sesame, sunflower, olive, peanut, safflower, corn, canola, and cottonseed. Salad dressings or mayonnaise made with a vegetable oil. Beverages Mineral water. Coffee and tea.  Diet carbonated beverages. Sweets and desserts Sherbet, gelatin, and fruit ice. Small amounts of dark chocolate. Limit all sweets and desserts. Seasonings and condiments All seasonings and condiments. The items listed above may not be a complete list of foods and drinks you can eat. Contact a dietitian for more options. What foods should I avoid? Fruits Canned fruit in heavy syrup. Fruit in cream or butter sauce. Fried fruit. Limit coconut. Vegetables Vegetables cooked in cheese, cream, or butter sauce. Fried vegetables. Grains Breads that are made with saturated or trans fats, oils, or whole milk. Croissants. Sweet rolls. Donuts. High-fat crackers, such as cheese crackers. Meats and other proteins Fatty meats, such as hot dogs, ribs, sausage, bacon, rib-eye roast or steak. High-fat deli meats, such as salami and bologna. Caviar. Domestic duck and goose. Organ meats, such as liver. Dairy Cream, sour cream, cream cheese, and creamed cottage cheese. Whole-milk cheeses. Whole or 2% milk that is liquid, evaporated, or condensed. Whole buttermilk. Cream sauce or high-fat cheese sauce. Yogurt that is made from whole milk. Fats and oils Meat fat, or shortening. Cocoa butter, hydrogenated oils, palm oil, coconut oil, palm kernel oil. Solid fats and shortenings, including bacon fat, salt pork, lard, and butter. Nondairy cream substitutes. Salad dressings with cheese or sour cream. Beverages Regular sodas and juice drinks with added sugar. Sweets and desserts Frosting. Pudding. Cookies. Cakes. Pies. Milk chocolate or white chocolate. Buttered syrups. Full-fat ice cream or ice cream drinks. The items listed above may not be a complete list of foods and drinks to avoid. Contact a dietitian for more information. Summary  Heart-healthy meal planning includes eating less unhealthy fats, eating more healthy fats, and making other changes in your diet.  Eat a balanced diet. This includes fruits and  vegetables, low-fat or nonfat dairy, lean protein, nuts and legumes, whole grains, and heart-healthy oils and fats. This information is not intended to replace advice given to you by your health care provider. Make sure you discuss any questions you have with your health care provider. Document Revised: 01/08/2018 Document Reviewed: 12/12/2017 Elsevier Patient Education  2020 [employer].      Why follow it? Research shows. . Those who follow the Mediterranean diet have a reduced risk of heart disease  . The diet is associated with a reduced incidence of Parkinson's and Alzheimer's diseases . People following the diet may have longer life expectancies and lower rates of chronic diseases  . The Dietary Guidelines for Americans recommends the Mediterranean diet as an eating plan to promote health and prevent disease  What Is the Mediterranean Diet?  . Healthy eating plan based on typical foods and recipes of Mediterranean-style cooking . The diet is primarily a plant based diet; these foods should make up a majority of meals   Starches - Plant based foods should make up a majority of meals - They are an important sources of vitamins, minerals, energy, antioxidants, and fiber - Choose whole grains, foods high in fiber and minimally processed items  - Typical grain sources include wheat, oats, barley, corn, brown rice, bulgar, farro, millet, polenta, couscous  -  Various types of beans include chickpeas, lentils, fava beans, black beans, white beans   Fruits  Veggies - Large quantities of antioxidant rich fruits & veggies; 6 or more servings  - Vegetables can be eaten raw or lightly drizzled with oil and cooked  - Vegetables common to the traditional Mediterranean Diet include: artichokes, arugula, beets, broccoli, brussel sprouts, cabbage, carrots, celery, collard greens, cucumbers, eggplant, kale, leeks, lemons, lettuce, mushrooms, okra, onions, peas, peppers, potatoes, pumpkin, radishes,  rutabaga, shallots, spinach, sweet potatoes, turnips, zucchini - Fruits common to the Mediterranean Diet include: apples, apricots, avocados, cherries, clementines, dates, figs, grapefruits, grapes, melons, nectarines, oranges, peaches, pears, pomegranates, strawberries, tangerines  Fats - Replace butter and margarine with healthy oils, such as olive oil, canola oil, and tahini  - Limit nuts to no more than a handful a day  - Nuts include walnuts, almonds, pecans, pistachios, pine nuts  - Limit or avoid candied, honey roasted or heavily salted nuts - Olives are central to the [employer] - can be eaten whole or used in a variety of dishes   Meats Protein - Limiting red meat: no more than a few times a month - When eating red meat: choose lean cuts and keep the portion to the size of deck of cards - Eggs: approx. 0 to 4 times a week  - Fish and lean poultry: at least 2 a week  - Healthy protein sources include, chicken, [place], lean beef, lamb - Increase intake of seafood such as tuna, salmon, trout, mackerel, shrimp, scallops - Avoid or limit high fat processed meats such as sausage and bacon  Dairy - Include moderate amounts of low fat dairy products  - Focus on healthy dairy such as fat free yogurt, skim milk, low or reduced fat cheese - Limit dairy products higher in fat such as whole or 2% milk, cheese, ice cream  Alcohol - Moderate amounts of red wine is ok  - No more than 5 oz daily for women (all ages) and men older than age 48  - No more than 10 oz of wine daily for men younger than 44  Other - Limit sweets and other desserts  - Use herbs and spices instead of salt to flavor foods  - Herbs and spices common to the traditional Mediterranean Diet include: basil, bay leaves, chives, cloves, cumin, fennel, garlic, lavender, marjoram, mint, oregano, parsley, pepper, rosemary, sage, savory, sumac, tarragon, thyme   It's not just a diet, it's a lifestyle:  . The Mediterranean diet  includes lifestyle factors typical of those in the region  . Foods, drinks and meals are best eaten with others and savored . Daily physical activity is important for overall good health . This could be strenuous exercise like running and aerobics . This could also be more leisurely activities such as walking, housework, yard-work, or taking the stairs . Moderation is the key; a balanced and healthy diet accommodates most foods and drinks . Consider portion sizes and frequency of consumption of certain foods   Meal Ideas & Options:  . Breakfast:  o Whole wheat toast or whole wheat English muffins with peanut butter & hard boiled egg o Steel cut oats topped with apples & cinnamon and skim milk  o Fresh fruit: banana, strawberries, melon, berries, peaches  o Smoothies: strawberries, bananas, greek yogurt, peanut butter o Low fat greek yogurt with blueberries and granola  o Egg white omelet with spinach and mushrooms o Breakfast couscous: whole wheat couscous, apricots, skim  milk, cranberries  . Sandwiches:  o Hummus and grilled vegetables (peppers, zucchini, squash) on whole wheat bread   o Grilled chicken on whole wheat pita with lettuce, tomatoes, cucumbers or tzatziki  o Tuna salad on whole wheat bread: tuna salad made with greek yogurt, olives, red peppers, capers, green onions o Garlic rosemary lamb pita: lamb sauted with garlic, rosemary, salt & pepper; add lettuce, cucumber, greek yogurt to pita - flavor with lemon juice and black pepper  . Seafood:  o Mediterranean grilled salmon, seasoned with garlic, basil, parsley, lemon juice and black pepper o Shrimp, lemon, and spinach whole-grain pasta salad made with low fat greek yogurt  o Seared scallops with lemon orzo  o Seared tuna steaks seasoned salt, pepper, coriander topped with tomato mixture of olives, tomatoes, olive oil, minced garlic, parsley, green onions and cappers  . Meats:  o Herbed greek chicken salad with kalamata  olives, cucumber, feta  o Red bell peppers stuffed with spinach, bulgur, lean ground beef (or lentils) & topped with feta   o Kebabs: skewers of chicken, tomatoes, onions, zucchini, squash  o [place] burgers: made with red onions, mint, dill, lemon juice, feta cheese topped with roasted red peppers . Vegetarian o Cucumber salad: cucumbers, artichoke hearts, celery, red onion, feta cheese, tossed in olive oil & lemon juice  o Hummus and whole grain pita points with a greek salad (lettuce, tomato, feta, olives, cucumbers, red onion) o Lentil soup with celery, carrots made with vegetable broth, garlic, salt and pepper  o Tabouli salad: parsley, bulgur, mint, scallions, cucumbers, tomato, radishes, lemon juice, olive oil, salt and pepper.

## 2020-08-03 NOTE — Progress Notes (Signed)
Established Patient Office Visit  Subjective:  Patient ID: Terry [name], female    DOB: [birth year]  Age: 39 y.o. MRN: [id number]  CC:  Chief Complaint  Patient presents with  . Anxiety  . Depression    HPI Gordie C Schader presents for follow up on mood management and discuss lab results, which most were essentially within normal limits. Pt reports anxiety is under good control and taking medications as directed without issues. Reports in the past when her triglycerides and cholesterol was elevated she made dietary changes and lost weight, but she has gradually regained weight.   Past Medical History:  Diagnosis Date  . Anxiety   . Celiac disease   . IBS (irritable bowel syndrome)   . Lipids abnormal   . Migraine   . Mononucleosis   . Paroxysmal SVT (supraventricular tachycardia) ([place])   . Tachycardia   . TMJ disease   . Urinary incontinence    after sneezing     Past Surgical History:  Procedure Laterality Date  . ABDOMINOPLASTY    . CESAREAN SECTION    . LAPAROSCOPY    . TOOTH EXTRACTION      Family History  Problem Relation Age of Onset  . Hypertension Other   . Aneurysm Maternal Grandmother   . Prostate cancer Maternal Grandfather   . Heart attack Paternal Grandmother   . Colon cancer Neg Hx   . Stomach cancer Neg Hx   . Rectal cancer Neg Hx   . Esophageal cancer Neg Hx   . Liver cancer Neg Hx     Social History   Socioeconomic History  . Marital status: Married    Spouse name: Not on file  . Number of children: 2  . Years of education: Not on file  . Highest education level: Not on file  Occupational History  . Occupation: Corporate treasurer  Tobacco Use  . Smoking status: Never Smoker  . Smokeless tobacco: Never Used  Vaping Use  . Vaping Use: Never used  Substance and Sexual Activity  . Alcohol use: Yes    Alcohol/week: 12.0 standard drinks    Types: 12 Standard drinks or equivalent per week  . Drug use: No  . Sexual activity: Yes     Partners: Male    Birth control/protection: Surgical    Comment: vasectomy  Other Topics Concern  . Not on file  Social History Narrative  . Not on file   Social Determinants of Health   Financial Resource Strain:   . Difficulty of Paying Living Expenses: Not on file  Food Insecurity:   . Worried About Charity fundraiser in the Last Year: Not on file  . Ran Out of Food in the Last Year: Not on file  Transportation Needs:   . Lack of Transportation (Medical): Not on file  . Lack of Transportation (Non-Medical): Not on file  Physical Activity:   . Days of Exercise per Week: Not on file  . Minutes of Exercise per Session: Not on file  Stress:   . Feeling of Stress : Not on file  Social Connections:   . Frequency of Communication with Friends and Family: Not on file  . Frequency of Social Gatherings with Friends and Family: Not on file  . Attends Religious Services: Not on file  . Active Member of Clubs or Organizations: Not on file  . Attends Archivist Meetings: Not on file  . Marital Status: Not on file  Intimate Partner Violence:   .  Fear of Current or Ex-Partner: Not on file  . Emotionally Abused: Not on file  . Physically Abused: Not on file  . Sexually Abused: Not on file    Outpatient Medications Prior to Visit  Medication Sig Dispense Refill  . atenolol (TENORMIN) 25 MG tablet Take 1 tablet (25 mg total) by mouth daily. 90 tablet 1  . busPIRone (BUSPAR) 5 MG tablet TAKE ONE-HALF (1/2) TABLET IN THE MORNING AND 1 TABLET AT BEDTIME 135 tablet 0  . loratadine (CLARITIN) 10 MG tablet Take 10 mg by mouth daily as needed for allergies.    . Multiple Vitamin (MULTIVITAMIN) tablet Take 1 tablet by mouth daily.    . sertraline (ZOLOFT) 100 MG tablet Take 1 tablet (100 mg total) by mouth daily. 90 tablet 0  . Vitamin D, Ergocalciferol, (DRISDOL) 1.25 MG (50000 UNIT) CAPS capsule TAKE 1 CAPSULE WEEKLY 12 capsule 0   No facility-administered medications prior to  visit.    Allergies  Allergen Reactions  . Wheat Bran Other (See Comments)    All gluten    ROS Review of Systems A fourteen system review of systems was performed and found to be positive as per HPI.  Objective:    Physical Exam General:  Well Developed, well nourished, appropriate for stated age.  Neuro:  Alert and oriented,  extra-ocular muscles intact  HEENT:  Normocephalic, atraumatic, neck supple, no JVD Skin:  no gross rash, warm, pink. Cardiac:  RRR, S1 S2 Respiratory:  ECTA B/L and A/P, Not using accessory muscles, speaking in full sentences- unlabored. Vascular:  Ext warm, no cyanosis apprec.; cap RF less 2 sec. No gross edema Psych:  No HI/SI, judgement and insight good, Euthymic mood. Full Affect.   BP 118/77   Pulse 93   Ht 5' 8"  (1.727 m)   Wt 199 lb 3.2 oz (90.4 kg)   LMP  (LMP Unknown)   SpO2 95%   BMI 30.29 kg/m  Wt Readings from Last 3 Encounters:  08/03/20 199 lb 3.2 oz (90.4 kg)  02/28/20 185 lb (83.9 kg)  02/01/20 182 lb 11.2 oz (82.9 kg)     Health Maintenance Due  Topic Date Due  . Hepatitis C Screening  Never done  . COVID-19 Vaccine (2 - Moderna 2-dose series) 02/17/2020  . INFLUENZA VACCINE  06/18/2020    There are no preventive care reminders to display for this patient.  Lab Results  Component Value Date   TSH 2.110 07/04/2020   Lab Results  Component Value Date   WBC 6.0 07/04/2020   HGB 13.7 07/04/2020   HCT 39.2 07/04/2020   MCV 93 07/04/2020   PLT 246 07/04/2020   Lab Results  Component Value Date   NA 139 07/04/2020   K 4.2 07/04/2020   CO2 22 07/04/2020   GLUCOSE 78 07/04/2020   BUN 11 07/04/2020   CREATININE 0.64 07/04/2020   BILITOT 0.3 07/04/2020   ALKPHOS 93 07/04/2020   AST 24 07/04/2020   ALT 33 (H) 07/04/2020   PROT 7.1 07/04/2020   ALBUMIN 4.3 07/04/2020   CALCIUM 9.5 07/04/2020   ANIONGAP 10 03/30/2017   GFR 86.69 11/20/2016   Lab Results  Component Value Date   CHOL 280 (H) 07/04/2020   Lab  Results  Component Value Date   HDL 54 07/04/2020   Lab Results  Component Value Date   LDLCALC 152 (H) 07/04/2020   Lab Results  Component Value Date   TRIG 394 (H) 07/04/2020   Lab  Results  Component Value Date   CHOLHDL 5.2 (H) 07/04/2020   Lab Results  Component Value Date   HGBA1C 5.1 07/04/2020      Assessment & Plan:   Problem List Items Addressed This Visit      Other   GAD (generalized anxiety disorder)   Panic disorder   Hypertriglyceridemia - Primary     GAD, Panic disorder: -Well controlled, GAD-7 score of 0 -Continue current medication regimen. -Will continue to monitor.  Hyperlipidemia, Hypertriglyceridemia: -Discussed with patient management options and recommended dietary changes and to increase physical activity. -Advised to start taking fish oil to help improve triglycerides and reduce simple carbohydrates.  -Will recheck lipid panel next OV and if it continues to be elevated will consider starting statin once weekly. Pt agreeable. -The 10-year ASCVD risk score Mikey Bussing DC Brooke Bonito., et al., 2013) is: 1%   Values used to calculate the score:     Age: 40 years     Sex: Female     Is Non-Hispanic African American: No     Diabetic: No     Tobacco smoker: No     Systolic Blood Pressure: 216 mmHg     Is BP treated: No     HDL Cholesterol: 54 mg/dL     Total Cholesterol: 280 mg/dL  Elevated liver enzymes: -Recent AST 24, ALT 33 -Recommend to avoid excessive Tylenol and alcohol. Recommendations are no more than 1 drink per day or 7 per week. -Plan to recheck hepatic function next OV.  No orders of the defined types were placed in this encounter.   Follow-up: Return in about 4 months (around 12/03/2020) for Mood, HLD and lab visit few days prior (lipid panel, cmp).    Lorrene Reid, PA-C

## 2020-08-05 ENCOUNTER — Other Ambulatory Visit: Payer: Self-pay | Admitting: Internal Medicine

## 2020-08-11 ENCOUNTER — Other Ambulatory Visit: Payer: Self-pay | Admitting: Physician Assistant

## 2020-08-11 DIAGNOSIS — F410 Panic disorder [episodic paroxysmal anxiety] without agoraphobia: Secondary | ICD-10-CM

## 2020-08-11 DIAGNOSIS — F411 Generalized anxiety disorder: Secondary | ICD-10-CM

## 2020-08-17 ENCOUNTER — Other Ambulatory Visit: Payer: Self-pay | Admitting: Physician Assistant

## 2020-08-17 DIAGNOSIS — F411 Generalized anxiety disorder: Secondary | ICD-10-CM

## 2020-08-28 ENCOUNTER — Encounter: Payer: Self-pay | Admitting: Physician Assistant

## 2020-08-31 ENCOUNTER — Ambulatory Visit (INDEPENDENT_AMBULATORY_CARE_PROVIDER_SITE_OTHER): Payer: BC Managed Care – PPO | Admitting: Physician Assistant

## 2020-08-31 ENCOUNTER — Other Ambulatory Visit: Payer: Self-pay

## 2020-08-31 VITALS — BP 109/74 | HR 87 | Wt 206.3 lb

## 2020-08-31 DIAGNOSIS — Z23 Encounter for immunization: Secondary | ICD-10-CM | POA: Diagnosis not present

## 2020-08-31 NOTE — Progress Notes (Signed)
Patient tolerated vaccine well. VIS given. AS, CMA

## 2020-10-10 DIAGNOSIS — Z20828 Contact with and (suspected) exposure to other viral communicable diseases: Secondary | ICD-10-CM | POA: Diagnosis not present

## 2020-10-24 ENCOUNTER — Other Ambulatory Visit: Payer: Self-pay | Admitting: Physician Assistant

## 2020-10-24 DIAGNOSIS — E559 Vitamin D deficiency, unspecified: Secondary | ICD-10-CM

## 2020-11-21 ENCOUNTER — Other Ambulatory Visit: Payer: BC Managed Care – PPO

## 2020-11-27 ENCOUNTER — Ambulatory Visit: Payer: BC Managed Care – PPO | Admitting: Physician Assistant

## 2020-12-12 ENCOUNTER — Other Ambulatory Visit: Payer: BC Managed Care – PPO

## 2021-01-12 ENCOUNTER — Other Ambulatory Visit: Payer: Self-pay | Admitting: Physician Assistant

## 2021-01-12 DIAGNOSIS — E785 Hyperlipidemia, unspecified: Secondary | ICD-10-CM

## 2021-01-12 DIAGNOSIS — E781 Pure hyperglyceridemia: Secondary | ICD-10-CM

## 2021-01-15 ENCOUNTER — Other Ambulatory Visit: Payer: Self-pay

## 2021-01-15 ENCOUNTER — Other Ambulatory Visit: Payer: BC Managed Care – PPO

## 2021-01-15 DIAGNOSIS — E781 Pure hyperglyceridemia: Secondary | ICD-10-CM

## 2021-01-15 DIAGNOSIS — E785 Hyperlipidemia, unspecified: Secondary | ICD-10-CM

## 2021-01-16 LAB — COMPREHENSIVE METABOLIC PANEL
ALT: 44 IU/L — ABNORMAL HIGH (ref 0–32)
AST: 26 IU/L (ref 0–40)
Albumin/Globulin Ratio: 1.7 (ref 1.2–2.2)
Albumin: 4.7 g/dL (ref 3.8–4.8)
Alkaline Phosphatase: 78 IU/L (ref 44–121)
BUN/Creatinine Ratio: 22 (ref 9–23)
BUN: 18 mg/dL (ref 6–24)
Bilirubin Total: 0.4 mg/dL (ref 0.0–1.2)
CO2: 22 mmol/L (ref 20–29)
Calcium: 9.5 mg/dL (ref 8.7–10.2)
Chloride: 101 mmol/L (ref 96–106)
Creatinine, Ser: 0.82 mg/dL (ref 0.57–1.00)
Globulin, Total: 2.7 g/dL (ref 1.5–4.5)
Glucose: 90 mg/dL (ref 65–99)
Potassium: 4.4 mmol/L (ref 3.5–5.2)
Sodium: 139 mmol/L (ref 134–144)
Total Protein: 7.4 g/dL (ref 6.0–8.5)
eGFR: 92 mL/min/{1.73_m2} (ref >59)

## 2021-01-16 LAB — LIPID PANEL
Chol/HDL Ratio: 7.2 ratio — ABNORMAL HIGH (ref 0.0–4.4)
Cholesterol, Total: 251 mg/dL — ABNORMAL HIGH (ref 100–199)
HDL: 35 mg/dL — ABNORMAL LOW (ref >39)
LDL Chol Calc (NIH): 172 mg/dL — ABNORMAL HIGH (ref 0–99)
Triglycerides: 233 mg/dL — ABNORMAL HIGH (ref 0–149)
VLDL Cholesterol Cal: 44 mg/dL — ABNORMAL HIGH (ref 5–40)

## 2021-01-17 ENCOUNTER — Ambulatory Visit (INDEPENDENT_AMBULATORY_CARE_PROVIDER_SITE_OTHER): Payer: BC Managed Care – PPO | Admitting: Physician Assistant

## 2021-01-17 ENCOUNTER — Other Ambulatory Visit: Payer: Self-pay

## 2021-01-17 ENCOUNTER — Encounter: Payer: Self-pay | Admitting: Physician Assistant

## 2021-01-17 VITALS — BP 104/70 | HR 89 | Temp 97.9°F | Ht 68.5 in | Wt 203.0 lb

## 2021-01-17 DIAGNOSIS — E785 Hyperlipidemia, unspecified: Secondary | ICD-10-CM | POA: Diagnosis not present

## 2021-01-17 DIAGNOSIS — Z7141 Alcohol abuse counseling and surveillance of alcoholic: Secondary | ICD-10-CM

## 2021-01-17 DIAGNOSIS — F411 Generalized anxiety disorder: Secondary | ICD-10-CM | POA: Diagnosis not present

## 2021-01-17 DIAGNOSIS — E781 Pure hyperglyceridemia: Secondary | ICD-10-CM

## 2021-01-17 DIAGNOSIS — R748 Abnormal levels of other serum enzymes: Secondary | ICD-10-CM

## 2021-01-17 DIAGNOSIS — E669 Obesity, unspecified: Secondary | ICD-10-CM | POA: Diagnosis not present

## 2021-01-17 MED ORDER — ROSUVASTATIN CALCIUM 5 MG PO TABS: ORAL_TABLET | ORAL | 0 refills | Status: DC

## 2021-01-17 NOTE — Progress Notes (Addendum)
Established Patient Office Visit  Subjective:  Patient ID: Terry [name], female    DOB: 02/28/1981  Age: 40 y.o. MRN: [id number]  CC:  Chief Complaint  Patient presents with  . Depression    HPI Terry [name] presents for follow up on mood management and hyperlipidemia.  Reports mood is doing well and has no concerns.  States 2 weeks ago started activity of diet to help with weight loss and dietary changes.  1 month ago stopped drinking alcohol during the week and most recently has completely eliminated alcohol.  Reports since the pandemic and working from home has noticed gradual weight gain and has been less active. Takes ibuprofen for pain if needed, does not take Tylenol.   Past Medical History:  Diagnosis Date  . Anxiety   . Celiac disease   . IBS (irritable bowel syndrome)   . Lipids abnormal   . Migraine   . Mononucleosis   . Paroxysmal SVT (supraventricular tachycardia) ([place])   . Tachycardia   . TMJ disease   . Urinary incontinence    after sneezing     Past Surgical History:  Procedure Laterality Date  . ABDOMINOPLASTY    . CESAREAN SECTION    . LAPAROSCOPY    . TOOTH EXTRACTION      Family History  Problem Relation Age of Onset  . Hypertension Other   . Aneurysm Maternal Grandmother   . Prostate cancer Maternal Grandfather   . Heart attack Paternal Grandmother   . Colon cancer Neg Hx   . Stomach cancer Neg Hx   . Rectal cancer Neg Hx   . Esophageal cancer Neg Hx   . Liver cancer Neg Hx     Social History   Socioeconomic History  . Marital status: Married    Spouse name: Not on file  . Number of children: 2  . Years of education: Not on file  . Highest education level: Not on file  Occupational History  . Occupation: Corporate treasurer  Tobacco Use  . Smoking status: Never Smoker  . Smokeless tobacco: Never Used  Vaping Use  . Vaping Use: Never used  Substance and Sexual Activity  . Alcohol use: Yes    Alcohol/week: 12.0 standard drinks     Types: 12 Standard drinks or equivalent per week  . Drug use: No  . Sexual activity: Yes    Partners: Male    Birth control/protection: Surgical    Comment: vasectomy  Other Topics Concern  . Not on file  Social History Narrative  . Not on file   Social Determinants of Health   Financial Resource Strain: Not on file  Food Insecurity: Not on file  Transportation Needs: Not on file  Physical Activity: Not on file  Stress: Not on file  Social Connections: Not on file  Intimate Partner Violence: Not on file    Outpatient Medications Prior to Visit  Medication Sig Dispense Refill  . atenolol (TENORMIN) 25 MG tablet TAKE 1 TABLET DAILY 90 tablet 3  . busPIRone (BUSPAR) 5 MG tablet TAKE ONE-HALF (1/2) TABLET IN THE MORNING AND 1 TABLET AT BEDTIME 270 tablet 0  . loratadine (CLARITIN) 10 MG tablet Take 10 mg by mouth daily as needed for allergies.    . Multiple Vitamin (MULTIVITAMIN) tablet Take 1 tablet by mouth daily.    . sertraline (ZOLOFT) 100 MG tablet TAKE 1 TABLET DAILY 90 tablet 1  . Vitamin D, Ergocalciferol, (DRISDOL) 1.25 MG (50000 UNIT) CAPS capsule  TAKE 1 CAPSULE WEEKLY 12 capsule 0   No facility-administered medications prior to visit.    Allergies  Allergen Reactions  . Wheat Bran Other (See Comments)    All gluten    ROS Review of Systems A fourteen system review of systems was performed and found to be positive as per HPI.  Objective:    Physical Exam General:  Well Developed, well nourished, appropriate for stated age.  Neuro:  Alert and oriented,  extra-ocular muscles intact  HEENT:  Normocephalic, atraumatic, neck supple, no carotid bruits appreciated  Skin:  no gross rash, warm, pink. Cardiac:  RRR, S1 S2 w/o murmur  Respiratory:  ECTA B/L w/o wheezing, Not using accessory muscles, speaking in full sentences- unlabored. Abd: Non-tender, no distension Vascular:  Ext warm, no cyanosis apprec.; cap RF less 2 sec. Psych:  No HI/SI, judgement and  insight good, Euthymic mood. Full Affect.   BP 104/70   Pulse 89   Temp 97.9 F (36.6 C)   Ht 5' 8.5" (1.74 m)   Wt 203 lb (92.1 kg)   LMP  (LMP Unknown)   SpO2 96%   BMI 30.42 kg/m  Wt Readings from Last 3 Encounters:  01/17/21 203 lb (92.1 kg)  08/31/20 206 lb 4.8 oz (93.6 kg)  08/03/20 199 lb 3.2 oz (90.4 kg)     Health Maintenance Due  Topic Date Due  . Hepatitis C Screening  Never done  . COVID-19 Vaccine (2 - Moderna 3-dose series) 02/17/2020    There are no preventive care reminders to display for this patient.  Lab Results  Component Value Date   TSH 2.110 07/04/2020   Lab Results  Component Value Date   WBC 6.0 07/04/2020   HGB 13.7 07/04/2020   HCT 39.2 07/04/2020   MCV 93 07/04/2020   PLT 246 07/04/2020   Lab Results  Component Value Date   NA 139 01/15/2021   K 4.4 01/15/2021   CO2 22 01/15/2021   GLUCOSE 90 01/15/2021   BUN 18 01/15/2021   CREATININE 0.82 01/15/2021   BILITOT 0.4 01/15/2021   ALKPHOS 78 01/15/2021   AST 26 01/15/2021   ALT 44 (H) 01/15/2021   PROT 7.4 01/15/2021   ALBUMIN 4.7 01/15/2021   CALCIUM 9.5 01/15/2021   ANIONGAP 10 03/30/2017   GFR 86.69 11/20/2016   Lab Results  Component Value Date   CHOL 251 (H) 01/15/2021   Lab Results  Component Value Date   HDL 35 (L) 01/15/2021   Lab Results  Component Value Date   LDLCALC 172 (H) 01/15/2021   Lab Results  Component Value Date   TRIG 233 (H) 01/15/2021   Lab Results  Component Value Date   CHOLHDL 7.2 (H) 01/15/2021   Lab Results  Component Value Date   HGBA1C 5.1 07/04/2020      Assessment & Plan:   Problem List Items Addressed This Visit      Other   GAD (generalized anxiety disorder)   Hypertriglyceridemia   Relevant Medications   rosuvastatin (CRESTOR) 5 MG tablet   Hyperlipidemia - Primary   Relevant Medications   rosuvastatin (CRESTOR) 5 MG tablet    Other Visit Diagnoses    Obesity, Class I, BMI 30-34.9       Elevated liver enzymes        Encounter for alcohol cessation counseling         GAD: -PHQ-9 score of 0, controlled. -Continue current medication regimen. -Will continue to monitor.  Hyperlipidemia,  hypertriglyceridemia: -Recent lipid panel: Total cholesterol 251, 2 glycerides 233, HDL 35, LDL 172 (has increased from prior, 152) -The 10-year ASCVD risk score Mikey Bussing DC Jr., et al., 2013) is: 1.5% -Discussed with patient management options including medications and potential side effects.  Patient is agreeable to start rosuvastatin 5 mg once weekly.  Advised to schedule lab visit for fasting blood work to repeat lipid panel and hepatic function in 6 weeks. Due to history of elevated liver enzymes will be cautious with statin therapy.  Encouraged to continue with weight loss efforts and dietary changes by reducing saturated and transfats. Continue alcohol cessation. Recommend to increase physical activity.  Obesity, class I, BMI 30-34.9: -Associated with hyperlipidemia and hypertriglyceridemia.  -Recommend to continue weight loss efforts with dietary and lifestyle changes.  Elevated liver enzymes: -Discussed with patient potential etiologies and wants to continue monitoring before proceeding with additional work up, especially since she recently stopped alcohol consumption and is working on weight loss.  -Continue to avoid Tylenol. Recommend to reduce fatty foods. -Will repeat hepatic function in 6 weeks.  Encounter for alcohol cessation counseling: -Praised patient for alcohol cessation and recommend to continue to avoid alcohol.  Meds ordered this encounter  Medications  . rosuvastatin (CRESTOR) 5 MG tablet    Sig: Take 1 tablet by mouth once weekly at bedtime.    Dispense:  8 tablet    Refill:  0    Order Specific Question:   Supervising Provider    Answer:   Beatrice Lecher D [[id number]]    Follow-up: Return in about 4 months (around 05/19/2021) for Mood, HLD; lab visit in 6 weeks FBW (lipid panel, hepatic  function).   Note:  This note was prepared with assistance of Dragon voice recognition software. Occasional wrong-word or sound-a-like substitutions may have occurred due to the inherent limitations of voice recognition software.   Lorrene Reid, PA-C

## 2021-01-17 NOTE — Patient Instructions (Signed)

## 2021-02-06 ENCOUNTER — Other Ambulatory Visit: Payer: Self-pay | Admitting: Physician Assistant

## 2021-02-06 DIAGNOSIS — Z1231 Encounter for screening mammogram for malignant neoplasm of breast: Secondary | ICD-10-CM

## 2021-02-07 ENCOUNTER — Other Ambulatory Visit: Payer: Self-pay | Admitting: Physician Assistant

## 2021-02-07 DIAGNOSIS — E559 Vitamin D deficiency, unspecified: Secondary | ICD-10-CM

## 2021-02-08 ENCOUNTER — Other Ambulatory Visit: Payer: Self-pay | Admitting: Physician Assistant

## 2021-02-08 DIAGNOSIS — F411 Generalized anxiety disorder: Secondary | ICD-10-CM

## 2021-02-23 ENCOUNTER — Other Ambulatory Visit: Payer: Self-pay | Admitting: Physician Assistant

## 2021-02-23 DIAGNOSIS — E785 Hyperlipidemia, unspecified: Secondary | ICD-10-CM

## 2021-02-23 DIAGNOSIS — Z0000 Encounter for general adult medical examination without abnormal findings: Secondary | ICD-10-CM

## 2021-02-28 ENCOUNTER — Other Ambulatory Visit: Payer: BC Managed Care – PPO

## 2021-03-07 DIAGNOSIS — Z20822 Contact with and (suspected) exposure to covid-19: Secondary | ICD-10-CM | POA: Diagnosis not present

## 2021-03-12 ENCOUNTER — Other Ambulatory Visit: Payer: Self-pay

## 2021-03-12 ENCOUNTER — Other Ambulatory Visit: Payer: BC Managed Care – PPO

## 2021-03-12 DIAGNOSIS — E785 Hyperlipidemia, unspecified: Secondary | ICD-10-CM | POA: Diagnosis not present

## 2021-03-12 DIAGNOSIS — Z0000 Encounter for general adult medical examination without abnormal findings: Secondary | ICD-10-CM

## 2021-03-12 DIAGNOSIS — Z1231 Encounter for screening mammogram for malignant neoplasm of breast: Secondary | ICD-10-CM

## 2021-03-13 LAB — HEPATIC FUNCTION PANEL
ALT: 38 IU/L — ABNORMAL HIGH (ref 0–32)
AST: 21 IU/L (ref 0–40)
Albumin: 4.3 g/dL (ref 3.8–4.8)
Alkaline Phosphatase: 80 IU/L (ref 44–121)
Bilirubin Total: 0.2 mg/dL (ref 0.0–1.2)
Bilirubin, Direct: <0.1 mg/dL (ref 0.00–0.40)
Total Protein: 7.2 g/dL (ref 6.0–8.5)

## 2021-03-13 LAB — LIPID PANEL
Chol/HDL Ratio: 6.9 ratio — ABNORMAL HIGH (ref 0.0–4.4)
Cholesterol, Total: 228 mg/dL — ABNORMAL HIGH (ref 100–199)
HDL: 33 mg/dL — ABNORMAL LOW (ref >39)
LDL Chol Calc (NIH): 159 mg/dL — ABNORMAL HIGH (ref 0–99)
Triglycerides: 196 mg/dL — ABNORMAL HIGH (ref 0–149)
VLDL Cholesterol Cal: 36 mg/dL (ref 5–40)

## 2021-03-14 ENCOUNTER — Telehealth: Payer: Self-pay

## 2021-03-14 NOTE — Telephone Encounter (Signed)
Called patient 03/14/2021. Patient verbal understood lab results and recommendations. Will call if she has any further questions. Lendon Ka, CMA

## 2021-03-14 NOTE — Telephone Encounter (Signed)
-----   Message from Lorrene Reid, [place] sent at 03/13/2021 10:48 AM EDT ----- Please call Ms. Veith and notify cholesterol panel is elevated but has improved. Triglycerides have decreased from 233 to 196 and likely related to alcohol cessation and statin therapy. Bad cholesterol have decreased from 172 to 159. Liver enzyme has improved and decreased from 44 to 38. Encourage to continue with weight loss efforts and dietary changes. Recommend to continue with once weekly Crestor. If liver enzyme continues to improve will consider increasing Crestor to twice weekly to better improve cholesterol. Will repeat labs at follow up visit.  Thank you, Herb Grays

## 2021-03-19 ENCOUNTER — Other Ambulatory Visit: Payer: Self-pay | Admitting: Physician Assistant

## 2021-03-19 DIAGNOSIS — F411 Generalized anxiety disorder: Secondary | ICD-10-CM

## 2021-03-19 DIAGNOSIS — F410 Panic disorder [episodic paroxysmal anxiety] without agoraphobia: Secondary | ICD-10-CM

## 2021-03-30 ENCOUNTER — Other Ambulatory Visit: Payer: Self-pay

## 2021-03-30 ENCOUNTER — Ambulatory Visit
Admission: RE | Admit: 2021-03-30 | Discharge: 2021-03-30 | Disposition: A | Discharge status: Home / Self Care | Payer: BC Managed Care – PPO | Source: Ambulatory Visit | Attending: Physician Assistant | Admitting: Physician Assistant

## 2021-03-30 DIAGNOSIS — Z1231 Encounter for screening mammogram for malignant neoplasm of breast: Secondary | ICD-10-CM | POA: Diagnosis not present

## 2021-04-04 ENCOUNTER — Other Ambulatory Visit: Payer: Self-pay | Admitting: Physician Assistant

## 2021-04-04 ENCOUNTER — Encounter: Payer: Self-pay | Admitting: Physician Assistant

## 2021-04-04 DIAGNOSIS — F410 Panic disorder [episodic paroxysmal anxiety] without agoraphobia: Secondary | ICD-10-CM

## 2021-04-04 DIAGNOSIS — E781 Pure hyperglyceridemia: Secondary | ICD-10-CM

## 2021-04-04 DIAGNOSIS — E785 Hyperlipidemia, unspecified: Secondary | ICD-10-CM

## 2021-04-04 DIAGNOSIS — F411 Generalized anxiety disorder: Secondary | ICD-10-CM

## 2021-04-04 MED ORDER — ROSUVASTATIN CALCIUM 5 MG PO TABS: ORAL_TABLET | ORAL | 0 refills | Status: DC

## 2021-04-10 ENCOUNTER — Encounter: Payer: Self-pay | Admitting: Physician Assistant

## 2021-05-23 ENCOUNTER — Ambulatory Visit: Payer: BC Managed Care – PPO | Admitting: Physician Assistant

## 2021-05-23 IMAGING — US US BREAST*R* LIMITED INC AXILLA
2 series · 8 of 8 positions shown · non-contrast
Comparison: Previous exam(s).

CLINICAL DATA: 39-year-old female recalled from screening mammogram
dated 03/01/2020 for possible right breast masses and possible
breast asymmetry.

EXAM:
DIGITAL DIAGNOSTIC BILATERAL MAMMOGRAM WITH CAD AND TOMO
ULTRASOUND RIGHT BREAST

[Series 1: us breast*right* limited inc axilla · 0.06mm/px · 6 of 6 slices shown (1 of 2)]
[im 1/6]
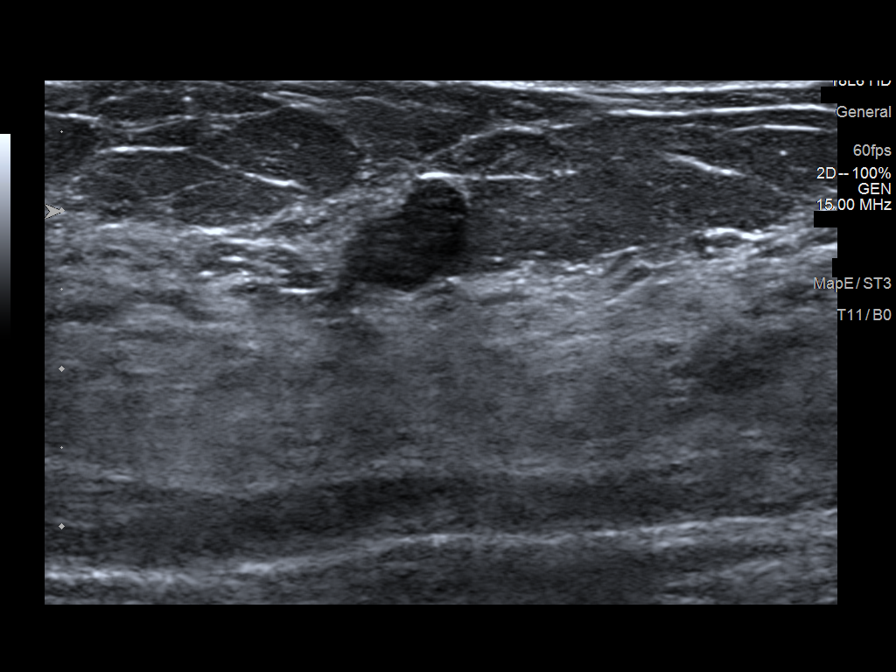
[im 2/6]
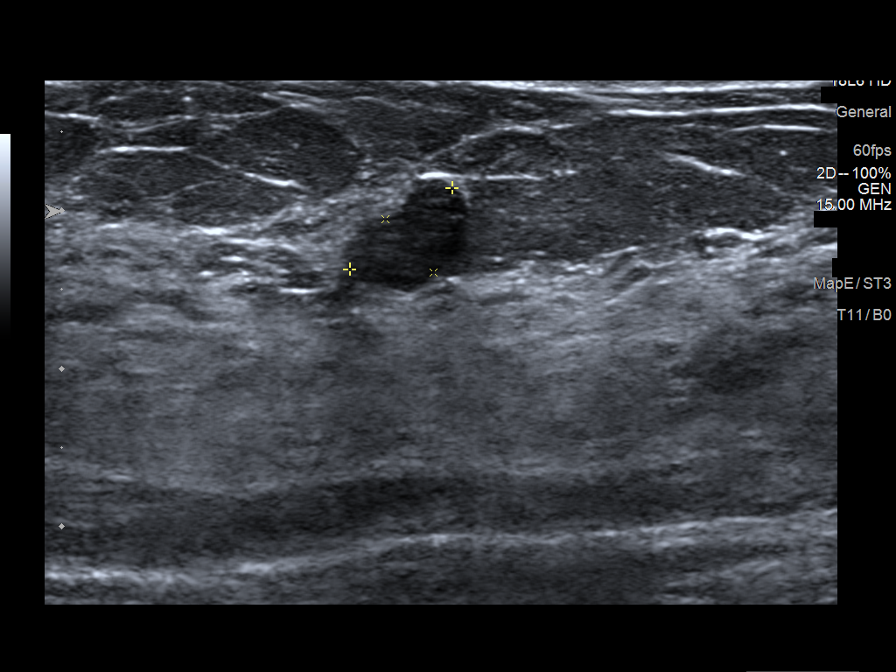
[im 3/6]
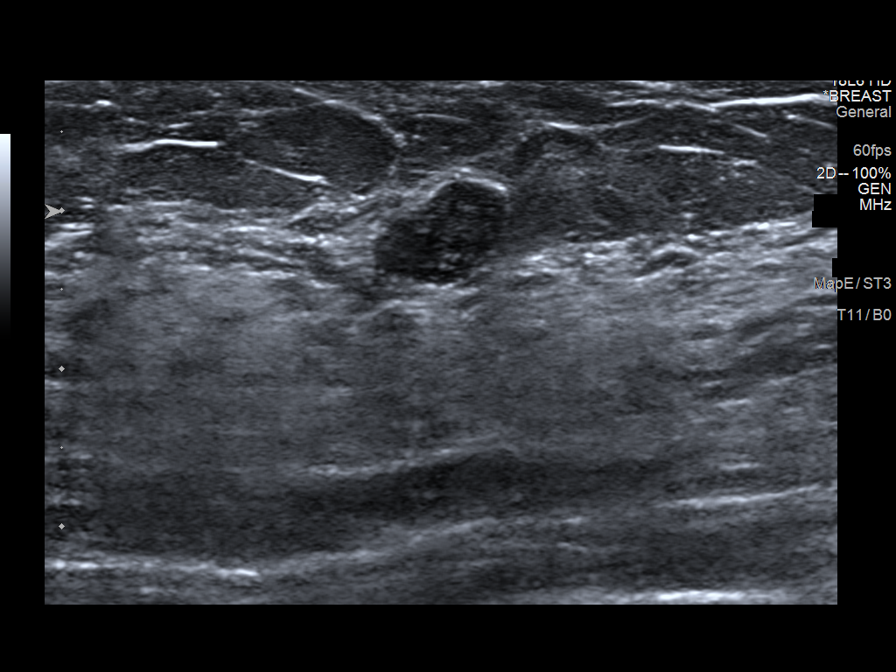
[im 4/6]
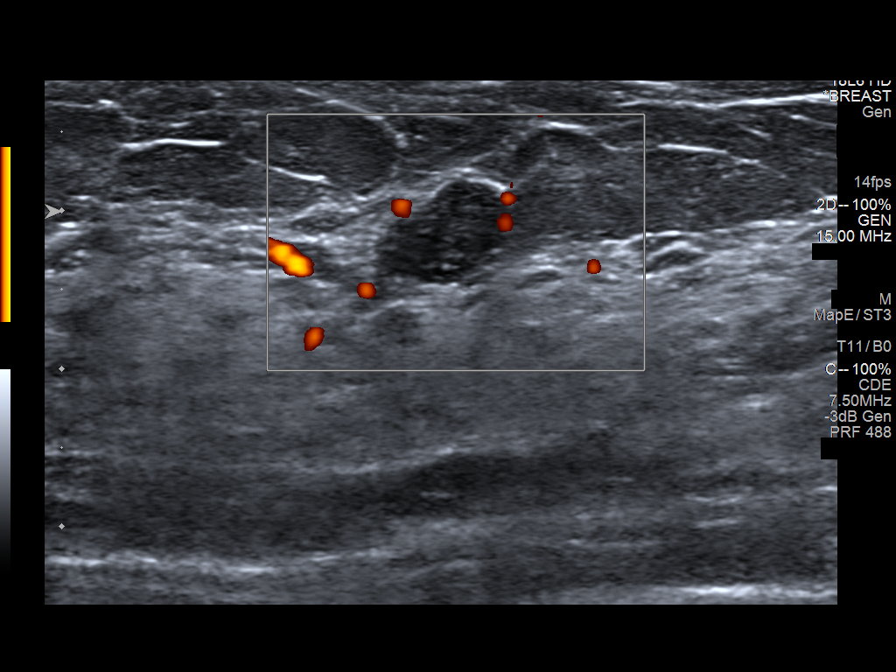
[im 5/6]
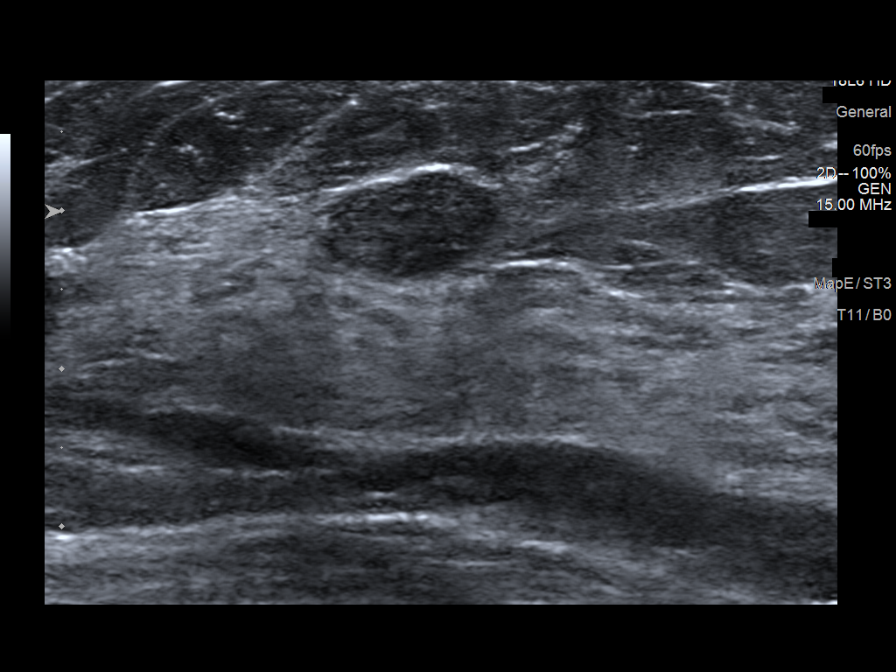
[im 6/6]
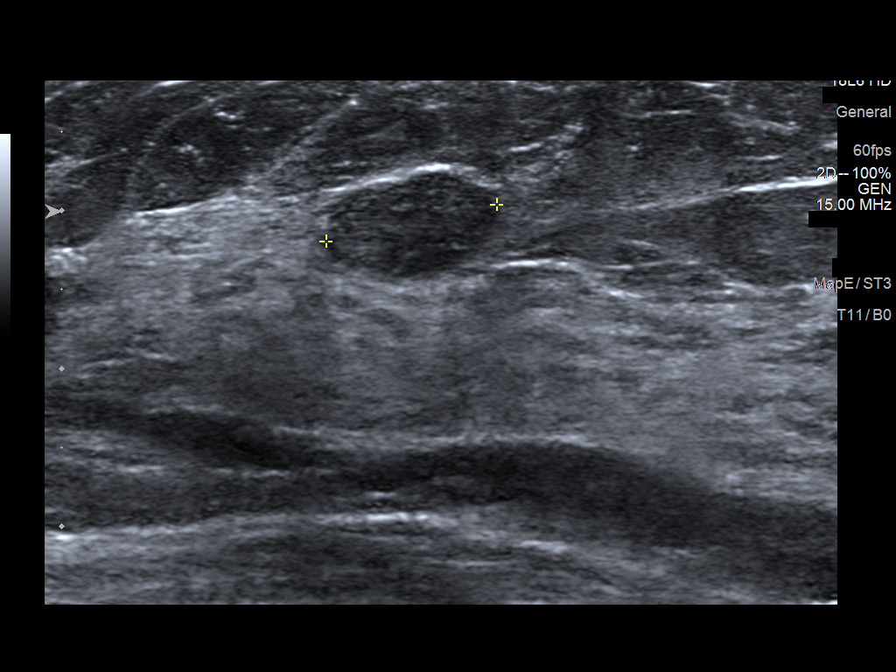

[Series 2: us breast*right* limited inc axilla · 0.07mm/px · 2 of 2 slices shown (2 of 2)]
[im 1/2]
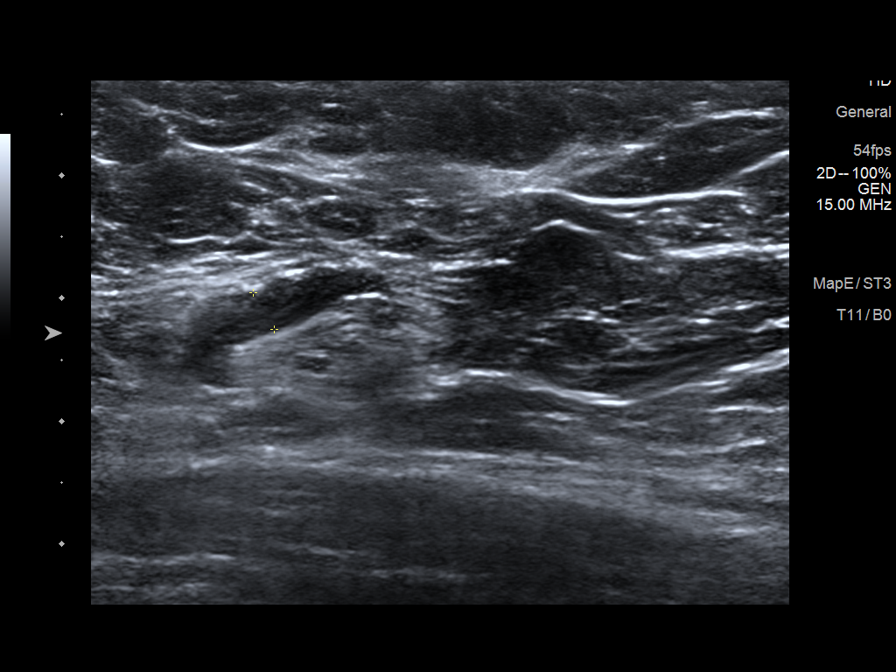
[im 2/2]
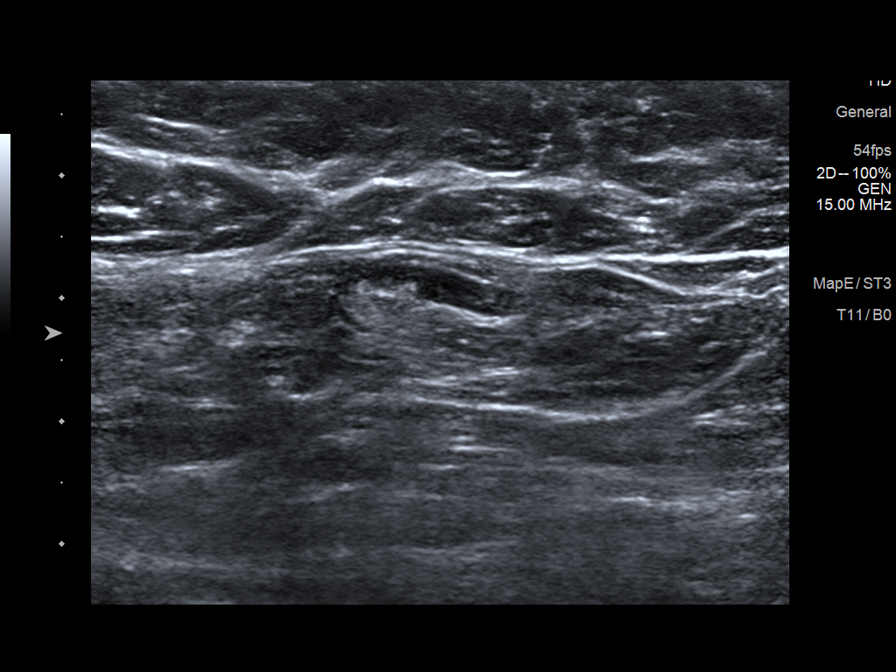

[8 of 8 positions shown; findings below may reference images not displayed]

ACR Breast Density Category d: The breast tissue is extremely dense,
which lowers the sensitivity of mammography.
FINDINGS: Additional spot compression and full field ML views of the bilateral
breasts were obtained. The previously described, possible asymmetry
in the far posterior, superior left breast resolves into well
dispersed fibroglandular tissue on today's additional views.

The possible mass in the inferior right breast at posterior depth
also resolves into fibroglandular tissue on additional views. There
is a persistent, oval, partially circumscribed partially obscured
mass in the superior right breast at middle depth. This localizes
slightly laterally on tomosynthesis. Further evaluation with
ultrasound was performed.

Mammographic images were processed with CAD.

Targeted ultrasound is performed, showing a hypoechoic mass at the 9
o'clock position 4 cm from the nipple on the right. It demonstrates
associated vascularity and measures 11 x 8 x 5 mm. This correlates
well with the mammographic finding. Evaluation of the right axilla
demonstrates no suspicious lymphadenopathy.
IMPRESSION: 1. Indeterminate 1.1 cm right breast mass at the 9 o'clock position
4 cm from the nipple. Recommendation is for ultrasound-guided
biopsy.
2. No suspicious lymphadenopathy.
3. No mammographic evidence of malignancy on the left.

RECOMMENDATION:
Ultrasound-guided biopsy of the right breast.

I have discussed the findings and recommendations with the patient.
If applicable, a reminder letter will be sent to the patient
regarding the next appointment.

BI-RADS CATEGORY  4: Suspicious.

## 2021-05-31 IMAGING — MG MM BREAST LOCALIZATION CLIP
4 series · 4 of 12 positions shown · non-contrast
Comparison: Previous exam(s).

CLINICAL DATA: Patient status post ultrasound-guided biopsy right
breast mass 9 o'clock position.

EXAM:
DIAGNOSTIC RIGHT MAMMOGRAM POST ULTRASOUND BIOPSY

[R CC synth-2D]
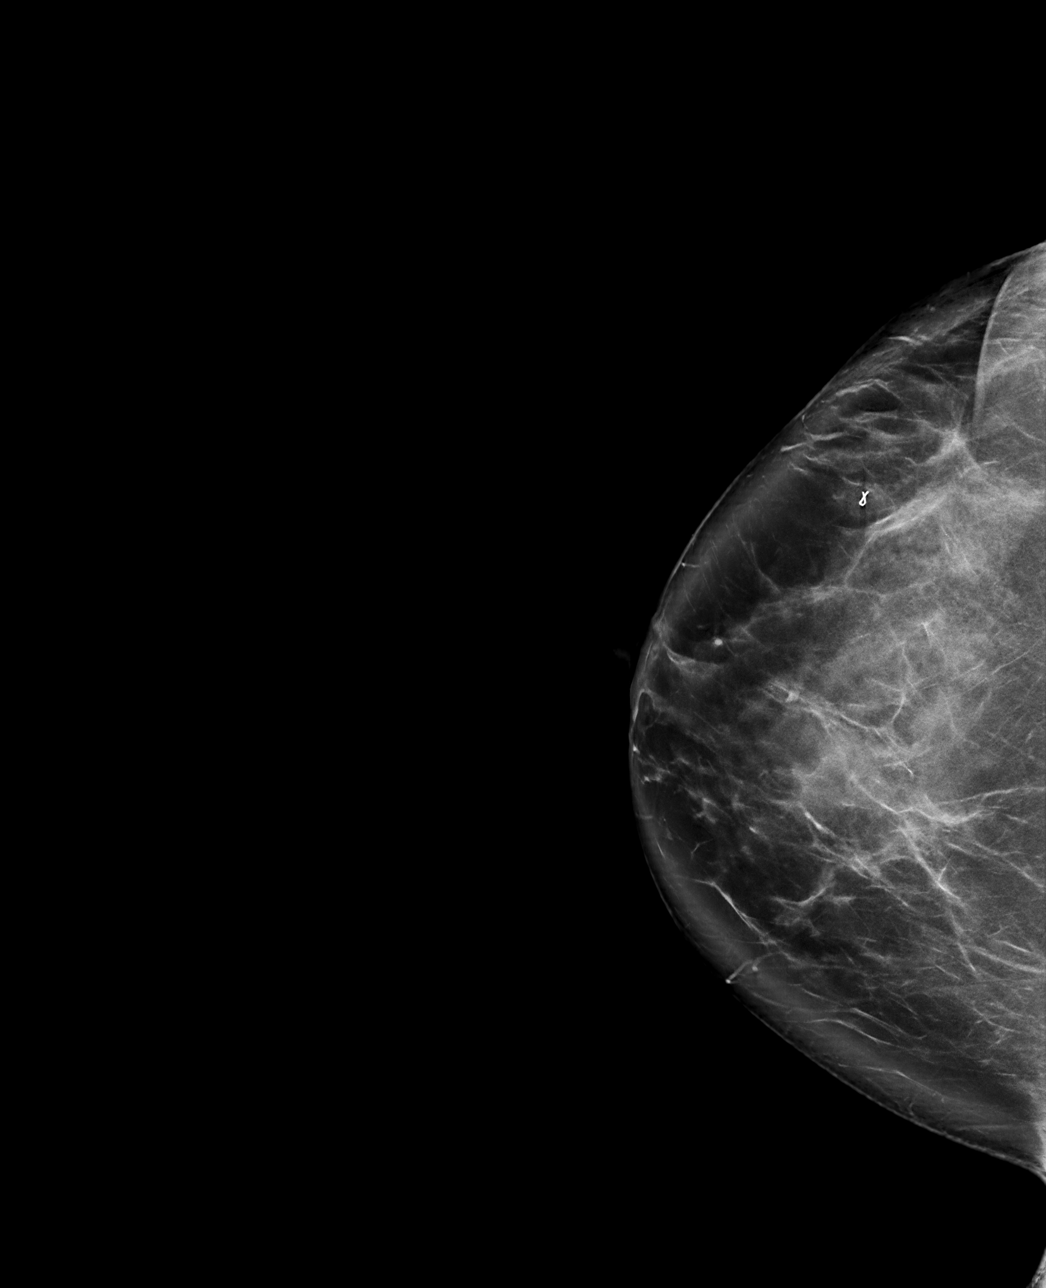

[R ML synth-2D]
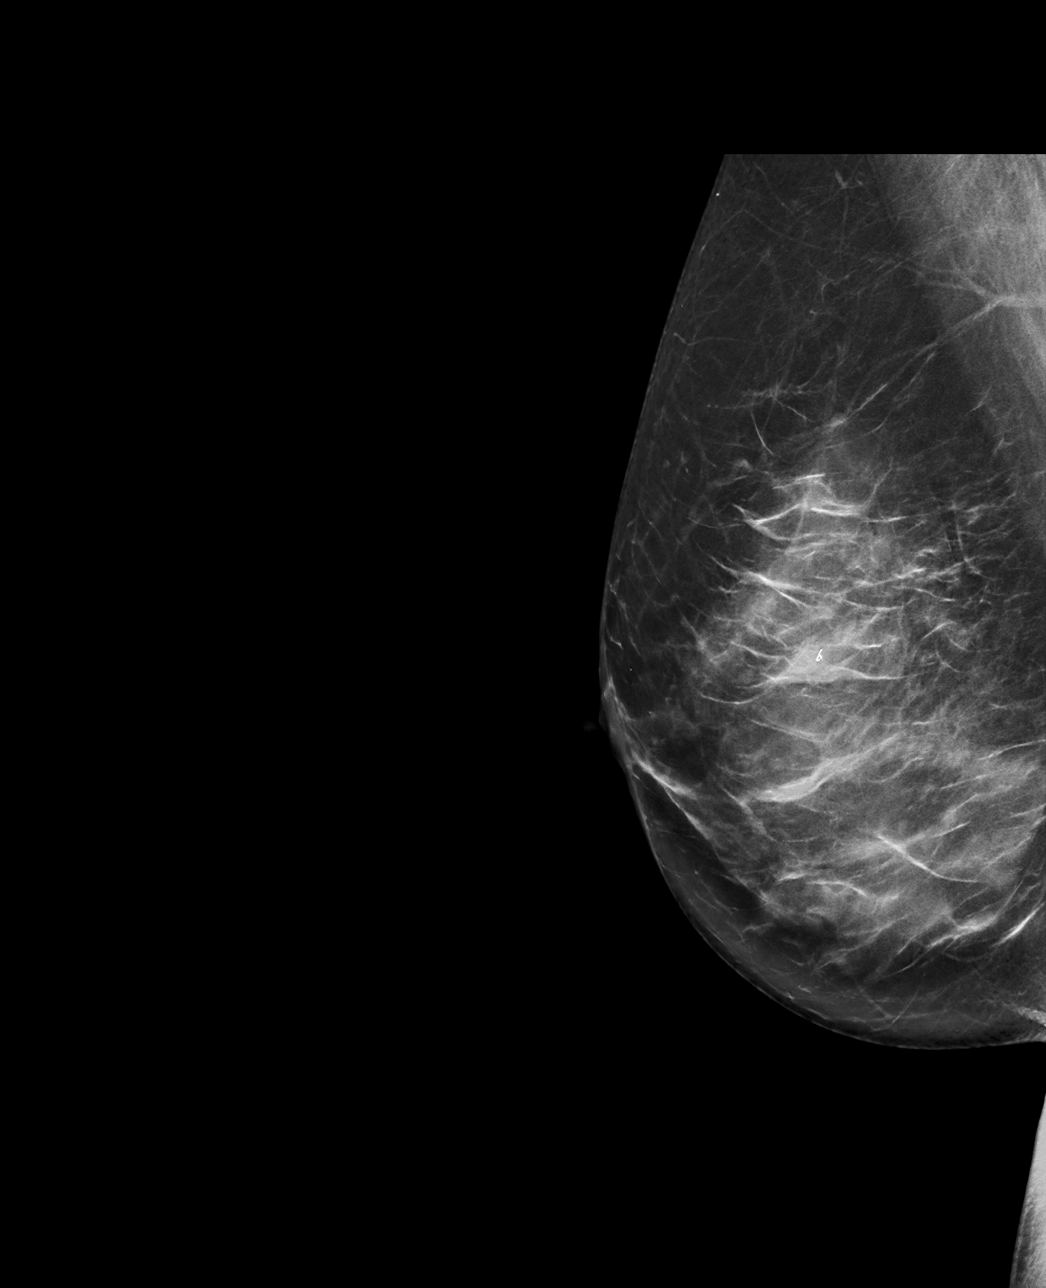

[R ML tomo · tomo slice 40/79.0]
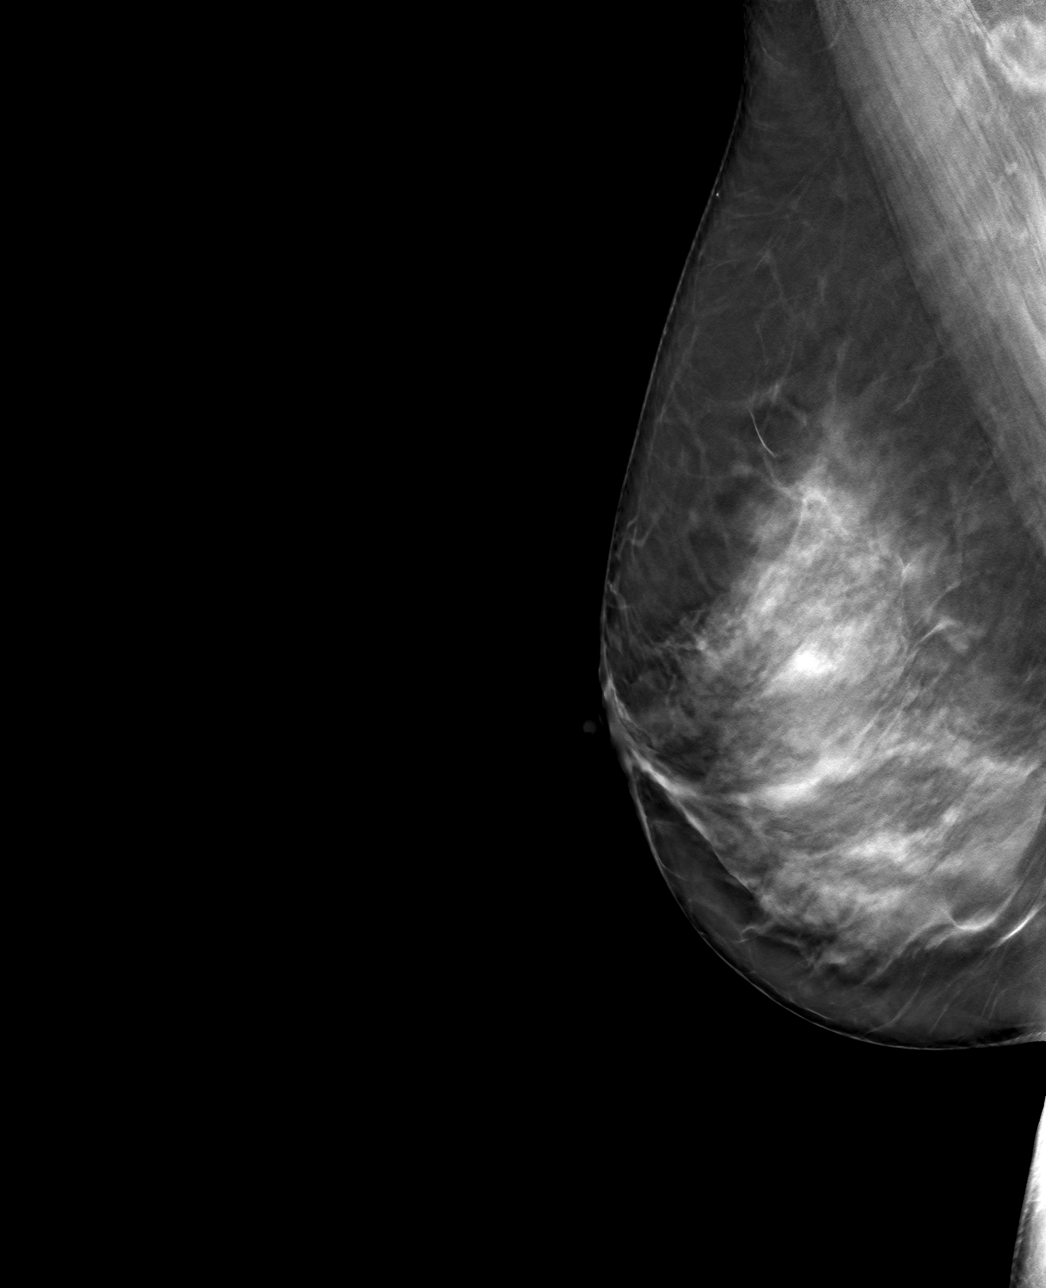

[R CC tomo · tomo slice 49/98.0]
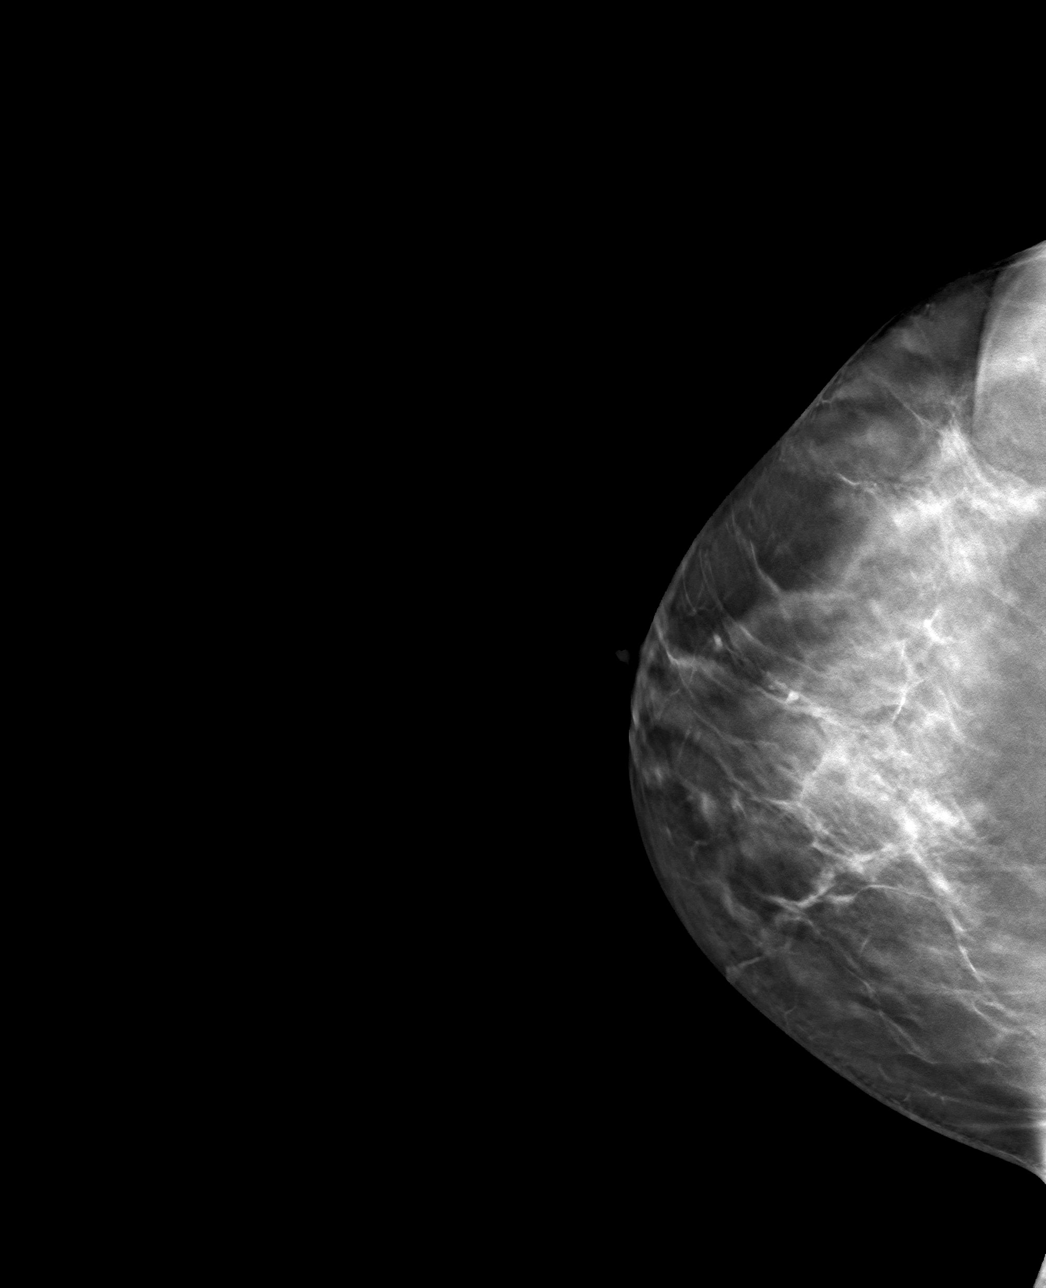

[4 of 12 positions shown; findings below may reference images not displayed]

FINDINGS: Mammographic images were obtained following ultrasound guided biopsy
of right breast mass 9 o'clock position. The biopsy marking clip is
in expected position at the site of biopsy.
IMPRESSION: Appropriate positioning of the ribbon shaped biopsy marking clip at
the site of biopsy in the right breast mass 9 o'clock position.

Final Assessment: Post Procedure Mammograms for Marker Placement

## 2021-06-18 ENCOUNTER — Encounter: Payer: Self-pay | Admitting: Physician Assistant

## 2021-06-18 ENCOUNTER — Ambulatory Visit (INDEPENDENT_AMBULATORY_CARE_PROVIDER_SITE_OTHER): Payer: BC Managed Care – PPO | Admitting: Physician Assistant

## 2021-06-18 ENCOUNTER — Other Ambulatory Visit: Payer: Self-pay

## 2021-06-18 VITALS — BP 118/75 | HR 76 | Temp 98.4°F | Ht 69.0 in | Wt 206.0 lb

## 2021-06-18 DIAGNOSIS — E781 Pure hyperglyceridemia: Secondary | ICD-10-CM

## 2021-06-18 DIAGNOSIS — F411 Generalized anxiety disorder: Secondary | ICD-10-CM | POA: Diagnosis not present

## 2021-06-18 DIAGNOSIS — E785 Hyperlipidemia, unspecified: Secondary | ICD-10-CM

## 2021-06-18 DIAGNOSIS — I471 Supraventricular tachycardia: Secondary | ICD-10-CM

## 2021-06-18 DIAGNOSIS — R748 Abnormal levels of other serum enzymes: Secondary | ICD-10-CM | POA: Diagnosis not present

## 2021-06-18 DIAGNOSIS — E669 Obesity, unspecified: Secondary | ICD-10-CM | POA: Diagnosis not present

## 2021-06-18 NOTE — Assessment & Plan Note (Addendum)
>>  ASSESSMENT AND PLAN FOR MIXED HYPERLIPIDEMIA WRITTEN ON 06/18/2021  8:47 AM BY Aylene Acoff, PA-C  -Last lipid panel: Total cholesterol 228, triglycerides 196, HDL 33, LDL 159 -Continue rosuvastatin once weekly. Last ALT 38. -Recommend to follow a low fat/carbohydrate diet and continue to stay as active as possible. -Will repeat lipid panel and hepatic function with CPE.  >>ASSESSMENT AND PLAN FOR HYPERTRIGLYCERIDEMIA WRITTEN ON 06/18/2021  8:44 AM BY Kishon Garriga, PA-C  -Last triglycerides: 196 -Recommend to reduce simple carbohydrates. Patient self-discontinued fish oil. -Will continue to monitor.

## 2021-06-18 NOTE — Assessment & Plan Note (Signed)
-  Controlled. -Continue current medication regimen. -Will continue to monitor.

## 2021-06-18 NOTE — Patient Instructions (Signed)
Heart-Healthy Eating Plan Heart-healthy meal planning includes: Eating less unhealthy fats. Eating more healthy fats. Making other changes in your diet. Talk with your doctor or a diet specialist (dietitian) to create an eating plan that is right for you. What is my plan? Your doctor may recommend an eating plan that includes: Total fat: ______% or less of total calories a day. Saturated fat: ______% or less of total calories a day. Cholesterol: less than _________mg a day. What are tips for following this plan? Cooking Avoid frying your food. Try to bake, boil, grill, or broil it instead. You can also reduce fat by: Removing the skin from poultry. Removing all visible fats from meats. Steaming vegetables in water or broth. Meal planning  At meals, divide your plate into four equal parts: Fill one-half of your plate with vegetables and green salads. Fill one-fourth of your plate with whole grains. Fill one-fourth of your plate with lean protein foods. Eat 4-5 servings of vegetables per day. A serving of vegetables is: 1 cup of raw or cooked vegetables. 2 cups of raw leafy greens. Eat 4-5 servings of fruit per day. A serving of fruit is: 1 medium whole fruit.  cup of dried fruit.  cup of fresh, frozen, or canned fruit.  cup of 100% fruit juice. Eat more foods that have soluble fiber. These are apples, broccoli, carrots, beans, peas, and barley. Try to get 20-30 g of fiber per day. Eat 4-5 servings of nuts, legumes, and seeds per week: 1 serving of dried beans or legumes equals  cup after being cooked. 1 serving of nuts is  cup. 1 serving of seeds equals 1 tablespoon.  General information Eat more home-cooked food. Eat less restaurant, buffet, and fast food. Limit or avoid alcohol. Limit foods that are high in starch and sugar. Avoid fried foods. Lose weight if you are overweight. Keep track of how much salt (sodium) you eat. This is important if you have high blood  pressure. Ask your doctor to tell you more about this. Try to add vegetarian meals each week. Fats Choose healthy fats. These include olive oil and canola oil, flaxseeds, walnuts, almonds, and seeds. Eat more omega-3 fats. These include salmon, mackerel, sardines, tuna, flaxseed oil, and ground flaxseeds. Try to eat fish at least 2 times each week. Check food labels. Avoid foods with trans fats or high amounts of saturated fat. Limit saturated fats. These are often found in animal products, such as meats, butter, and cream. These are also found in plant foods, such as palm oil, palm kernel oil, and coconut oil. Avoid foods with partially hydrogenated oils in them. These have trans fats. Examples are stick margarine, some tub margarines, cookies, crackers, and other baked goods. What foods can I eat? Fruits All fresh, canned (in natural juice), or frozen fruits. Vegetables Fresh or frozen vegetables (raw, steamed, roasted, or grilled). Green salads. Grains Most grains. Choose whole wheat and whole grains most of the time. Rice andpasta, including brown rice and pastas made with whole wheat. Meats and other proteins Lean, well-trimmed beef, veal, pork, and lamb. Chicken and [place] without skin. All fish and shellfish. Wild duck, rabbit, pheasant, and venison. Egg whites or low-cholesterol egg substitutes. Dried beans, peas, lentils, and tofu. Seedsand most nuts. Dairy Low-fat or nonfat cheeses, including ricotta and mozzarella. Skim or 1% milk that is liquid, powdered, or evaporated. Buttermilk that is made with low-fatmilk. Nonfat or low-fat yogurt. Fats and oils Non-hydrogenated (trans-free) margarines. Vegetable oils, including soybean, sesame,  sunflower, olive, peanut, safflower, corn, canola, and cottonseed. Salad dressings or mayonnaisemade with a vegetable oil. Beverages Mineral water. Coffee and tea. Diet carbonated beverages. Sweets and desserts Sherbet, gelatin, and fruit ice. Small  amounts of dark chocolate. Limit all sweets and desserts. Seasonings and condiments All seasonings and condiments. The items listed above may not be a complete list of foods and drinks you can eat. Contact a dietitian for more options. What foods should I avoid? Fruits Canned fruit in heavy syrup. Fruit in cream or butter sauce. Fried fruit. Limitcoconut. Vegetables Vegetables cooked in cheese, cream, or butter sauce. Fried vegetables. Grains Breads that are made with saturated or trans fats, oils, or whole milk. Croissants. Sweet rolls. Donuts. High-fat crackers,such as cheese crackers. Meats and other proteins Fatty meats, such as hot dogs, ribs, sausage, bacon, rib-eye roast or steak. High-fat deli meats, such as salami and bologna. Caviar. Domestic duck andgoose. Organ meats, such as liver. Dairy Cream, sour cream, cream cheese, and creamed cottage cheese. Whole-milk cheeses. Whole or 2% milk that is liquid, evaporated, or condensed. Whole buttermilk. Cream sauce or high-fat cheese sauce. Yogurt that is made fromwhole milk. Fats and oils Meat fat, or shortening. Cocoa butter, hydrogenated oils, palm oil, coconut oil, palm kernel oil. Solid fats and shortenings, including bacon fat, salt pork, lard, and butter. Nondairy cream substitutes. Salad dressings with cheeseor sour cream. Beverages Regular sodas and juice drinks with added sugar. Sweets and desserts Frosting. Pudding. Cookies. Cakes. Pies. Milk chocolate or white chocolate.Buttered syrups. Full-fat ice cream or ice cream drinks. The items listed above may not be a complete list of foods and drinks to avoid. Contact a dietitian for more information. Summary Heart-healthy meal planning includes eating less unhealthy fats, eating more healthy fats, and making other changes in your diet. Eat a balanced diet. This includes fruits and vegetables, low-fat or nonfat dairy, lean protein, nuts and legumes, whole grains, and heart-healthy  oils and fats. This information is not intended to replace advice given to you by your health care provider. Make sure you discuss any questions you have with your healthcare provider. Document Revised: 01/08/2018 Document Reviewed: 12/12/2017 Elsevier Patient Education  2022 [employer].

## 2021-06-18 NOTE — Progress Notes (Signed)
Established Patient Office Visit  Subjective:  Patient ID: Terry [name], female    DOB: 04/19/1981  Age: 40 y.o. MRN: [id number]  CC:  Chief Complaint  Patient presents with   Follow-up    Mood   Hyperlipidemia    HPI Terry [name] presents for follow up on hyperlipidemia and mood management.  HLD: Pt taking medication as directed without issues. Reports did [employer] weight loss plan but once she stopped the program gained some of the weight back. States has been swimming. Reports is ok with her current weight and will try to maintain with monitoring her diet and calorie intake.  Mood: Reports medication compliance. Denies mood changes or SI/HI.  Alcohol use: Reports continues to avoid drinking during the week and only has one drink socially which is on the weekend or every few weeks.   SVT: Patient reports occasionally has fluttering sensation which last briefly. Taking medication as instructed.  Past Medical History:  Diagnosis Date   Anxiety    Celiac disease    IBS (irritable bowel syndrome)    Lipids abnormal    Migraine    Mononucleosis    Paroxysmal SVT (supraventricular tachycardia) (HCC)    Tachycardia    TMJ disease    Urinary incontinence    after sneezing     Past Surgical History:  Procedure Laterality Date   ABDOMINOPLASTY     CESAREAN SECTION     LAPAROSCOPY     TOOTH EXTRACTION      Family History  Problem Relation Age of Onset   Hypertension Other    Aneurysm Maternal Grandmother    Prostate cancer Maternal Grandfather    Heart attack Paternal Grandmother    Colon cancer Neg Hx    Stomach cancer Neg Hx    Rectal cancer Neg Hx    Esophageal cancer Neg Hx    Liver cancer Neg Hx     Social History   Socioeconomic History   Marital status: Married    Spouse name: Not on file   Number of children: 2   Years of education: Not on file   Highest education level: Not on file  Occupational History   Occupation: Corporate treasurer  Tobacco  Use   Smoking status: Never   Smokeless tobacco: Never  Vaping Use   Vaping Use: Never used  Substance and Sexual Activity   Alcohol use: Yes    Alcohol/week: 12.0 standard drinks    Types: 12 Standard drinks or equivalent per week   Drug use: No   Sexual activity: Yes    Partners: Male    Birth control/protection: Surgical    Comment: vasectomy  Other Topics Concern   Not on file  Social History Narrative   Not on file   Social Determinants of Health   Financial Resource Strain: Not on file  Food Insecurity: Not on file  Transportation Needs: Not on file  Physical Activity: Not on file  Stress: Not on file  Social Connections: Not on file  Intimate Partner Violence: Not on file    Outpatient Medications Prior to Visit  Medication Sig Dispense Refill   atenolol (TENORMIN) 25 MG tablet TAKE 1 TABLET DAILY 90 tablet 3   busPIRone (BUSPAR) 5 MG tablet TAKE ONE-HALF (1/2) TABLET IN THE MORNING AND 1 TABLET AT BEDTIME 270 tablet 0   loratadine (CLARITIN) 10 MG tablet Take 10 mg by mouth daily as needed for allergies.     Multiple Vitamin (MULTIVITAMIN) tablet Take  1 tablet by mouth daily.     rosuvastatin (CRESTOR) 5 MG tablet Take 1 tablet by mouth once weekly at bedtime. 8 tablet 0   sertraline (ZOLOFT) 100 MG tablet TAKE 1 TABLET DAILY 90 tablet 1   Vitamin D, Ergocalciferol, (DRISDOL) 1.25 MG (50000 UNIT) CAPS capsule TAKE 1 CAPSULE WEEKLY 12 capsule 3   No facility-administered medications prior to visit.    Allergies  Allergen Reactions   Wheat Bran Other (See Comments)    All gluten    ROS Review of Systems Review of Systems:  A fourteen system review of systems was performed and found to be positive as per HPI.   Objective:    Physical Exam General:  Well Developed, well nourished, in no acute distress  Neuro:  Alert and oriented,  extra-ocular muscles intact  HEENT:  Normocephalic, atraumatic, neck supple Skin:  no gross rash, warm, pink. Cardiac:   RRR, S1 S2 wnl's Respiratory:  ECTA B/L and A/P w/o wheezing, Not using accessory muscles, speaking in full sentences- unlabored. Vascular:  Ext warm, no cyanosis apprec.; cap RF less 2 sec. Psych:  No HI/SI, judgement and insight good, Euthymic mood. Full Affect.  BP 118/75   Pulse 76   Temp 98.4 F (36.9 C)   Ht 5' 9"  (1.753 m)   Wt 206 lb (93.4 kg)   LMP 06/12/2021   SpO2 98%   BMI 30.42 kg/m  Wt Readings from Last 3 Encounters:  06/18/21 206 lb (93.4 kg)  01/17/21 203 lb (92.1 kg)  08/31/20 206 lb 4.8 oz (93.6 kg)     Health Maintenance Due  Topic Date Due   Pneumococcal Vaccine 71-16 Years old (1 - PCV) Never done   Hepatitis C Screening  Never done   COVID-19 Vaccine (4 - Booster for Moderna series) 12/20/2020   INFLUENZA VACCINE  06/18/2021    There are no preventive care reminders to display for this patient.  Lab Results  Component Value Date   TSH 2.110 07/04/2020   Lab Results  Component Value Date   WBC 6.0 07/04/2020   HGB 13.7 07/04/2020   HCT 39.2 07/04/2020   MCV 93 07/04/2020   PLT 246 07/04/2020   Lab Results  Component Value Date   NA 139 01/15/2021   K 4.4 01/15/2021   CO2 22 01/15/2021   GLUCOSE 90 01/15/2021   BUN 18 01/15/2021   CREATININE 0.82 01/15/2021   BILITOT 0.2 03/12/2021   ALKPHOS 80 03/12/2021   AST 21 03/12/2021   ALT 38 (H) 03/12/2021   PROT 7.2 03/12/2021   ALBUMIN 4.3 03/12/2021   CALCIUM 9.5 01/15/2021   ANIONGAP 10 03/30/2017   EGFR 92 01/15/2021   GFR 86.69 11/20/2016   Lab Results  Component Value Date   CHOL 228 (H) 03/12/2021   Lab Results  Component Value Date   HDL 33 (L) 03/12/2021   Lab Results  Component Value Date   LDLCALC 159 (H) 03/12/2021   Lab Results  Component Value Date   TRIG 196 (H) 03/12/2021   Lab Results  Component Value Date   CHOLHDL 6.9 (H) 03/12/2021   Lab Results  Component Value Date   HGBA1C 5.1 07/04/2020      Assessment & Plan:   Problem List Items Addressed  This Visit       Cardiovascular and Mediastinum   h/o Paroxysmal SVT (supraventricular tachycardia) (HCC) (Chronic)    -Followed by Cardiology. -On atenolol 25 mg.  Other   GAD (generalized anxiety disorder)    -Controlled. -Continue current medication regimen. -Will continue to monitor.       Hypertriglyceridemia    -Last triglycerides: 196 -Recommend to reduce simple carbohydrates. Patient self-discontinued fish oil. -Will continue to monitor.       Hyperlipidemia - Primary    -Last lipid panel: Total cholesterol 228, triglycerides 106, HDL 33, LDL 159 -Continue rosuvastatin once weekly. Last ALT 38. -Recommend to follow a low fat/carbohydrate diet and continue to stay as active as possible. -Will repeat lipid panel and hepatic function with CPE.       Other Visit Diagnoses     Elevated liver enzymes       Obesity, Class I, BMI 30-34.9          Elevated liver enzymes: -Last AST 21, ALT 38 -Encourage to continue to limit alcohol use to 1 drink/day. -Will repeat hepatic function with CPE.  Obesity, Class I, BMI 30-34.9: -Associated with hyperlipidemia. -Continue weight loss efforts with dietary and lifestyle modifications.   No orders of the defined types were placed in this encounter.   Follow-up: Return in about 3 months (around 09/18/2021) for CPE and FBW 1 wk prior .   Note:  This note was prepared with assistance of Dragon voice recognition software. Occasional wrong-word or sound-a-like substitutions may have occurred due to the inherent limitations of voice recognition software.  Lorrene Reid, PA-C

## 2021-06-18 NOTE — Assessment & Plan Note (Signed)
-  Followed by Cardiology. -On atenolol 25 mg.

## 2021-06-18 NOTE — Assessment & Plan Note (Signed)
-  Last triglycerides: 196 -Recommend to reduce simple carbohydrates. Patient self-discontinued fish oil. -Will continue to monitor.

## 2021-07-09 ENCOUNTER — Other Ambulatory Visit: Payer: Self-pay | Admitting: Physician Assistant

## 2021-07-09 DIAGNOSIS — E781 Pure hyperglyceridemia: Secondary | ICD-10-CM

## 2021-07-09 DIAGNOSIS — E785 Hyperlipidemia, unspecified: Secondary | ICD-10-CM

## 2021-08-08 ENCOUNTER — Other Ambulatory Visit: Payer: Self-pay | Admitting: Physician Assistant

## 2021-08-08 DIAGNOSIS — F411 Generalized anxiety disorder: Secondary | ICD-10-CM

## 2021-08-21 ENCOUNTER — Other Ambulatory Visit: Payer: Self-pay

## 2021-08-21 ENCOUNTER — Ambulatory Visit (INDEPENDENT_AMBULATORY_CARE_PROVIDER_SITE_OTHER): Payer: BC Managed Care – PPO

## 2021-08-21 VITALS — BP 116/79 | HR 93 | Temp 98.8°F | Ht 69.0 in | Wt 216.5 lb

## 2021-08-21 DIAGNOSIS — Z23 Encounter for immunization: Secondary | ICD-10-CM | POA: Diagnosis not present

## 2021-08-27 ENCOUNTER — Other Ambulatory Visit: Payer: Self-pay | Admitting: Internal Medicine

## 2021-08-27 ENCOUNTER — Other Ambulatory Visit: Payer: Self-pay | Admitting: Physician Assistant

## 2021-08-27 DIAGNOSIS — F410 Panic disorder [episodic paroxysmal anxiety] without agoraphobia: Secondary | ICD-10-CM

## 2021-08-27 DIAGNOSIS — F411 Generalized anxiety disorder: Secondary | ICD-10-CM

## 2021-09-14 ENCOUNTER — Other Ambulatory Visit: Payer: Self-pay | Admitting: Physician Assistant

## 2021-09-14 DIAGNOSIS — E781 Pure hyperglyceridemia: Secondary | ICD-10-CM

## 2021-09-14 DIAGNOSIS — E785 Hyperlipidemia, unspecified: Secondary | ICD-10-CM

## 2021-10-18 DIAGNOSIS — L578 Other skin changes due to chronic exposure to nonionizing radiation: Secondary | ICD-10-CM | POA: Diagnosis not present

## 2021-10-18 DIAGNOSIS — L821 Other seborrheic keratosis: Secondary | ICD-10-CM | POA: Diagnosis not present

## 2021-10-18 DIAGNOSIS — D1801 Hemangioma of skin and subcutaneous tissue: Secondary | ICD-10-CM | POA: Diagnosis not present

## 2021-10-18 DIAGNOSIS — L814 Other melanin hyperpigmentation: Secondary | ICD-10-CM | POA: Diagnosis not present

## 2021-11-07 ENCOUNTER — Other Ambulatory Visit: Payer: Self-pay | Admitting: Physician Assistant

## 2021-11-07 DIAGNOSIS — F411 Generalized anxiety disorder: Secondary | ICD-10-CM

## 2021-11-26 ENCOUNTER — Other Ambulatory Visit: Payer: Self-pay | Admitting: Internal Medicine

## 2021-12-06 ENCOUNTER — Other Ambulatory Visit: Payer: Self-pay | Admitting: Internal Medicine

## 2021-12-06 ENCOUNTER — Encounter: Payer: Self-pay | Admitting: Internal Medicine

## 2021-12-06 MED ORDER — ATENOLOL 25 MG PO TABS: 25.0000 mg | ORAL_TABLET | Freq: Every day | ORAL | 0 refills | Status: DC

## 2021-12-21 ENCOUNTER — Encounter: Payer: Self-pay | Admitting: Physician Assistant

## 2021-12-26 NOTE — Progress Notes (Signed)
Established Patient Office Visit  Subjective:  Patient ID: Terry [name], female    DOB: 28-Mar-1981  Age: 41 y.o. MRN: [id number]  CC:  Chief Complaint  Patient presents with   Weight Loss    HPI Terry [name] presents for discussion of weight loss treatment options. In the past has tried Weight Watchers, lost 27 pounds within a 2 year period. Also tried [employer]avia for about a year and lost about 30 pounds. Has also tried keto and low carb diets. Has been unsuccessful with maintaining weight off. States currently her physical activity level is walking mostly on the weekends. Has not tried weight loss medication in the past.   Past Medical History:  Diagnosis Date   Anxiety    Celiac disease    IBS (irritable bowel syndrome)    Lipids abnormal    Migraine    Mononucleosis    Paroxysmal SVT (supraventricular tachycardia) (HCC)    Tachycardia    TMJ disease    Urinary incontinence    after sneezing     Past Surgical History:  Procedure Laterality Date   ABDOMINOPLASTY     CESAREAN SECTION     LAPAROSCOPY     TOOTH EXTRACTION      Family History  Problem Relation Age of Onset   Hypertension Other    Aneurysm Maternal Grandmother    Prostate cancer Maternal Grandfather    Heart attack Paternal Grandmother    Colon cancer Neg Hx    Stomach cancer Neg Hx    Rectal cancer Neg Hx    Esophageal cancer Neg Hx    Liver cancer Neg Hx     Social History   Socioeconomic History   Marital status: Married    Spouse name: Not on file   Number of children: 2   Years of education: Not on file   Highest education level: Not on file  Occupational History   Occupation: Corporate treasurer  Tobacco Use   Smoking status: Never   Smokeless tobacco: Never  Vaping Use   Vaping Use: Never used  Substance and Sexual Activity   Alcohol use: Yes    Alcohol/week: 12.0 standard drinks    Types: 12 Standard drinks or equivalent per week   Drug use: No   Sexual activity: Yes     Partners: Male    Birth control/protection: Surgical    Comment: vasectomy  Other Topics Concern   Not on file  Social History Narrative   Not on file   Social Determinants of Health   Financial Resource Strain: Not on file  Food Insecurity: Not on file  Transportation Needs: Not on file  Physical Activity: Not on file  Stress: Not on file  Social Connections: Not on file  Intimate Partner Violence: Not on file    Outpatient Medications Prior to Visit  Medication Sig Dispense Refill   atenolol (TENORMIN) 25 MG tablet Take 1 tablet (25 mg total) by mouth daily. Please keep upcoming appt. With Cardiologist in order to receive future refills. Thank You. 90 tablet 0   busPIRone (BUSPAR) 5 MG tablet TAKE ONE-HALF (1/2) TABLET IN THE MORNING AND 1 TABLET AT BEDTIME 270 tablet 0   loratadine (CLARITIN) 10 MG tablet Take 10 mg by mouth daily as needed for allergies.     Multiple Vitamin (MULTIVITAMIN) tablet Take 1 tablet by mouth daily.     rosuvastatin (CRESTOR) 5 MG tablet TAKE 1 TABLET ONCE A WEEK AT BEDTIME 12 tablet 1  sertraline (ZOLOFT) 100 MG tablet Take 1 tablet (100 mg total) by mouth daily. **PLEASE CONTACT OUR OFFICE TO SCHEDULE A FOLLOW UP FOR FUTURE MED REFILLS** 30 tablet 0   Vitamin D, Ergocalciferol, (DRISDOL) 1.25 MG (50000 UNIT) CAPS capsule TAKE 1 CAPSULE WEEKLY 12 capsule 3   No facility-administered medications prior to visit.    Allergies  Allergen Reactions   Wheat Bran Other (See Comments)    All gluten    ROS Review of Systems Review of Systems:  A fourteen system review of systems was performed and found to be positive as per HPI.    Objective:    Physical Exam General:  Well Developed, well nourished, in no acute distress  Neuro:  Alert and oriented,  extra-ocular muscles intact  HEENT:  Normocephalic, atraumatic, neck supple  Skin:  no gross rash, warm, pink. Cardiac:  RRR, S1 S2 Abdomen: In supine position with head lifted a soft bulge is  noted, no tenderness to palpation, no distension or rebound tenderness  Respiratory: Speaking in full sentences, unlabored. No respiratory distress.  Vascular:  Ext warm, no cyanosis apprec.; cap RF less 2 sec. Psych:  No HI/SI, judgement and insight good, Euthymic mood. Full Affect.  BP 123/73    Pulse 90    Temp 98.1 F (36.7 C)    Ht _0  (1.727 m)    Wt 220 lb (99.8 kg)    SpO2 96%    BMI 33.45 kg/m  Wt Readings from Last 3 Encounters:  01/01/22 220 lb (99.8 kg)  08/21/21 216 lb 8 oz (98.2 kg)  06/18/21 206 lb (93.4 kg)     Health Maintenance Due  Topic Date Due   Hepatitis C Screening  Never done   COVID-19 Vaccine (3 - Moderna risk series) 10/17/2020    There are no preventive care reminders to display for this patient.  Lab Results  Component Value Date   TSH 2.110 07/04/2020   Lab Results  Component Value Date   WBC 6.0 07/04/2020   HGB 13.7 07/04/2020   HCT 39.2 07/04/2020   MCV 93 07/04/2020   PLT 246 07/04/2020   Lab Results  Component Value Date   NA 139 01/15/2021   K 4.4 01/15/2021   CO2 22 01/15/2021   GLUCOSE 90 01/15/2021   BUN 18 01/15/2021   CREATININE 0.82 01/15/2021   BILITOT 0.2 03/12/2021   ALKPHOS 80 03/12/2021   AST 21 03/12/2021   ALT 38 (H) 03/12/2021   PROT 7.2 03/12/2021   ALBUMIN 4.3 03/12/2021   CALCIUM 9.5 01/15/2021   ANIONGAP 10 03/30/2017   EGFR 92 01/15/2021   GFR 86.69 11/20/2016   Lab Results  Component Value Date   CHOL 228 (H) 03/12/2021   Lab Results  Component Value Date   HDL 33 (L) 03/12/2021   Lab Results  Component Value Date   LDLCALC 159 (H) 03/12/2021   Lab Results  Component Value Date   TRIG 196 (H) 03/12/2021   Lab Results  Component Value Date   CHOLHDL 6.9 (H) 03/12/2021   Lab Results  Component Value Date   HGBA1C 5.1 07/04/2020      Assessment & Plan:   Problem List Items Addressed This Visit   None Visit Diagnoses     Class 1 obesity with serious comorbidity and body mass  index (BMI) of 33.0 to 33.9 in adult, unspecified obesity type    -  Primary   Relevant Medications   Semaglutide-Weight Management (WEGOVY)  0.5 MG/0.5ML SOAJ      Class 1 obesity with serious comorbidity and body mass index (BMI) of 33.0 to 33.9 in adult, unspecified obesity type: -Associated with hyperlipidemia. Patient has cardiovascular risk factors and would benefit from weight loss with dietary and lifestyle changes in conjunction with medication therapy. In the past has been unsuccessful with long-term weight loss programs including Weight Watcher's and [employer]. Patient has a hx of SVT and not a good candidate for phentermine or Qsymia. Will start Wegovy. No personal or family hx of MTC or MEN. Discussed potential side effects. Will provide sample of starting dose 0.25 mg once a week x 4 weeks and send rx for 0.5 mg to start after completing 0.25 mg. Will reassess weight and medication therapy in 4 weeks. Of note, advised patient abdominal bulge is suggestive of diastasis recti, recommend to monitor for now.       Meds ordered this encounter  Medications   Semaglutide-Weight Management (WEGOVY) 0.5 MG/0.5ML SOAJ    Sig: Inject 0.5 mg into the skin once a week.    Dispense:  2 mL    Refill:  0    Order Specific Question:   Supervising Provider    Answer:   Beatrice Lecher D [[id number]    Follow-up: Return in about 4 weeks (around 01/29/2022) for Weight .    Lorrene Reid, PA-C

## 2022-01-01 ENCOUNTER — Ambulatory Visit (INDEPENDENT_AMBULATORY_CARE_PROVIDER_SITE_OTHER): Payer: BC Managed Care – PPO | Admitting: Physician Assistant

## 2022-01-01 ENCOUNTER — Other Ambulatory Visit: Payer: Self-pay

## 2022-01-01 ENCOUNTER — Encounter: Payer: Self-pay | Admitting: Physician Assistant

## 2022-01-01 VITALS — BP 123/73 | HR 90 | Temp 98.1°F | Ht 68.0 in | Wt 220.0 lb

## 2022-01-01 DIAGNOSIS — E669 Obesity, unspecified: Secondary | ICD-10-CM | POA: Diagnosis not present

## 2022-01-01 DIAGNOSIS — Z6833 Body mass index (BMI) 33.0-33.9, adult: Secondary | ICD-10-CM

## 2022-01-01 MED ORDER — WEGOVY 0.5 MG/0.5ML ~~LOC~~ SOAJ: 0.5000 mg | SUBCUTANEOUS | 0 refills | Status: DC

## 2022-01-01 NOTE — Patient Instructions (Signed)

## 2022-01-04 ENCOUNTER — Other Ambulatory Visit: Payer: Self-pay | Admitting: Physician Assistant

## 2022-01-04 ENCOUNTER — Encounter: Payer: Self-pay | Admitting: Physician Assistant

## 2022-01-04 ENCOUNTER — Other Ambulatory Visit: Payer: Self-pay

## 2022-01-04 DIAGNOSIS — F411 Generalized anxiety disorder: Secondary | ICD-10-CM

## 2022-01-04 MED ORDER — SERTRALINE HCL 100 MG PO TABS: 100.0000 mg | ORAL_TABLET | Freq: Every day | ORAL | 0 refills | Status: DC

## 2022-01-05 ENCOUNTER — Other Ambulatory Visit: Payer: Self-pay | Admitting: Physician Assistant

## 2022-01-05 DIAGNOSIS — E559 Vitamin D deficiency, unspecified: Secondary | ICD-10-CM

## 2022-01-14 ENCOUNTER — Encounter: Payer: Self-pay | Admitting: Physician Assistant

## 2022-01-15 ENCOUNTER — Other Ambulatory Visit: Payer: Self-pay | Admitting: Physician Assistant

## 2022-01-15 DIAGNOSIS — E669 Obesity, unspecified: Secondary | ICD-10-CM

## 2022-01-16 MED ORDER — WEGOVY 0.5 MG/0.5ML ~~LOC~~ SOAJ: 0.5000 mg | SUBCUTANEOUS | 0 refills | Status: DC

## 2022-01-21 ENCOUNTER — Encounter: Payer: Self-pay | Admitting: Physician Assistant

## 2022-01-21 ENCOUNTER — Other Ambulatory Visit: Payer: Self-pay

## 2022-01-21 ENCOUNTER — Encounter: Payer: Self-pay | Admitting: Internal Medicine

## 2022-01-21 ENCOUNTER — Ambulatory Visit (INDEPENDENT_AMBULATORY_CARE_PROVIDER_SITE_OTHER): Payer: BC Managed Care – PPO | Admitting: Internal Medicine

## 2022-01-21 VITALS — Ht 68.0 in | Wt 208.9 lb

## 2022-01-21 DIAGNOSIS — G901 Familial dysautonomia [Riley-Day]: Secondary | ICD-10-CM | POA: Diagnosis not present

## 2022-01-21 DIAGNOSIS — I471 Supraventricular tachycardia: Secondary | ICD-10-CM | POA: Diagnosis not present

## 2022-01-21 NOTE — Patient Instructions (Signed)
Medication Instructions:  ?Your physician recommends that you continue on your current medications as directed. Please refer to the Current Medication list given to you today. ? ?*If you need a refill on your cardiac medications before your next appointment, please call your pharmacy* ? ? ?Lab Work: ?None ordered. ? ?If you have labs (blood work) drawn today and your tests are completely normal, you will receive your results only by: ?MyChart Message (if you have MyChart) OR ?A paper copy in the mail ?If you have any lab test that is abnormal or we need to change your treatment, we will call you to review the results. ? ? ?Testing/Procedures: ?None ordered. ? ? ? ?Follow-Up: ?At [facility], you and your health needs are our priority.  As part of our continuing mission to provide you with exceptional heart care, we have created designated Provider Care Teams.  These Care Teams include your primary Cardiologist (physician) and Advanced Practice Providers (APPs -  Physician Assistants and Nurse Practitioners) who all work together to provide you with the care you need, when you need it. ? ?We recommend signing up for the patient portal called "MyChart".  Sign up information is provided on this After Visit Summary.  MyChart is used to connect with patients for Virtual Visits (Telemedicine).  Patients are able to view lab/test results, encounter notes, upcoming appointments, etc.  Non-urgent messages can be sent to your provider as well.   ?To learn more about what you can do with MyChart, go to [email or web address].   ? ?Your next appointment:   ? Follow up with Dr Caryl Comes as needed ?

## 2022-01-21 NOTE — Progress Notes (Signed)
?HPI ? ?Terry [name] is a 41 y.o. female ?Seen in followup for dysautonmia that was aggravated by pregnancy now consummated   ? ?Also hx of SVT  ? ?She is being seen in the hospital 2018 having presented with tachycardia which we felt was probably SVT having degenerated into atrial fibrillation.  There were some wide-complex beats and concern about possible ventricular tachycardia.  She underwent signal-averaged ECG and cMRI which were both normal. ? ?No significant palpitations. ? ?No chest pain or shortness of breath. ? ?Has put on about 60 pounds over the last few years.  Related to working from home, not exercising.  Has started another diet and has lost 15 pounds in the last couple of weeks.  Is very excited. ? ?Lightheadedness is minimally problematic ?DATE TEST EF   ?5/18 Echo   60-65 %   ?6/18 cMRI  65 % O/w normal  ?     ? ? ?  ? ?  ?Past Medical History:  ?Diagnosis Date  ? Anxiety   ? Celiac disease   ? IBS (irritable bowel syndrome)   ? Lipids abnormal   ? Migraine   ? Mononucleosis   ? Paroxysmal SVT (supraventricular tachycardia) (HCC)   ? Tachycardia   ? TMJ disease   ? Urinary incontinence   ? after sneezing   ? ? ?Past Surgical History:  ?Procedure Laterality Date  ? ABDOMINOPLASTY    ? CESAREAN SECTION    ? LAPAROSCOPY    ? TOOTH EXTRACTION    ? ? ?Current Outpatient Medications  ?Medication Sig Dispense Refill  ? atenolol (TENORMIN) 25 MG tablet Take 1 tablet (25 mg total) by mouth daily. Please keep upcoming appt. With Cardiologist in order to receive future refills. Thank You. 90 tablet 0  ? busPIRone (BUSPAR) 5 MG tablet TAKE ONE-HALF (1/2) TABLET IN THE MORNING AND 1 TABLET AT BEDTIME 270 tablet 0  ? loratadine (CLARITIN) 10 MG tablet Take 10 mg by mouth daily as needed for allergies.    ? rosuvastatin (CRESTOR) 5 MG tablet TAKE 1 TABLET ONCE A WEEK AT BEDTIME 12 tablet 1  ? Semaglutide-Weight Management (WEGOVY) 0.5 MG/0.5ML SOAJ Inject 0.5 mg into the skin once a week. 2 mL 0  ?  sertraline (ZOLOFT) 100 MG tablet Take 1 tablet (100 mg total) by mouth daily. 90 tablet 0  ? sertraline (ZOLOFT) 100 MG tablet Take 1 tablet (100 mg total) by mouth daily. 30 tablet 0  ? Vitamin D, Ergocalciferol, (DRISDOL) 1.25 MG (50000 UNIT) CAPS capsule TAKE 1 CAPSULE WEEKLY 12 capsule 3  ? ?No current facility-administered medications for this visit.  ? ? ?Allergies  ?Allergen Reactions  ? Wheat Bran Other (See Comments)  ?  All gluten  ? ? ?Review of Systems negative except from HPI and PMH ? ?Physical Exam ?Ht 5' 8"  (1.727 m)   Wt 208 lb 14.4 oz (94.8 kg)   SpO2 95%   BMI 31.76 kg/m?  ?Well developed and nourished in no acute distress ?HENT normal ?Neck supple with JVP-  flat   ?Clear ?Regular rate and rhythm, no murmurs or gallops ?Abd-soft with active BS ?No Clubbing cyanosis edema ?Skin-warm and dry ?A & Oriented  Grossly normal sensory and motor function ? ?ECG sinus 78 ?14/08/38 ? ? ? ?  ?Assessment and  Plan ? ?Dysautonomia ? ?SVT ? ?No interval SVT of which she is aware. ? ?Dysautonomia symptoms are largely quiescient: We will continue her on atenolol 25 mg daily. ? ?  Long discussion about the importance of weight loss and the concomitant role of exercise. ? ?

## 2022-01-22 ENCOUNTER — Other Ambulatory Visit: Payer: Self-pay

## 2022-01-22 DIAGNOSIS — Z6833 Body mass index (BMI) 33.0-33.9, adult: Secondary | ICD-10-CM

## 2022-01-22 DIAGNOSIS — E669 Obesity, unspecified: Secondary | ICD-10-CM

## 2022-01-22 MED ORDER — WEGOVY 0.5 MG/0.5ML ~~LOC~~ SOAJ: 0.5000 mg | SUBCUTANEOUS | 0 refills | Status: DC

## 2022-01-28 ENCOUNTER — Ambulatory Visit (INDEPENDENT_AMBULATORY_CARE_PROVIDER_SITE_OTHER): Payer: BC Managed Care – PPO | Admitting: Physician Assistant

## 2022-01-28 ENCOUNTER — Other Ambulatory Visit: Payer: Self-pay

## 2022-01-28 ENCOUNTER — Encounter: Payer: Self-pay | Admitting: Physician Assistant

## 2022-01-28 VITALS — BP 105/68 | HR 86 | Temp 97.6°F | Ht 68.0 in | Wt 208.0 lb

## 2022-01-28 DIAGNOSIS — Z6833 Body mass index (BMI) 33.0-33.9, adult: Secondary | ICD-10-CM | POA: Diagnosis not present

## 2022-01-28 DIAGNOSIS — E669 Obesity, unspecified: Secondary | ICD-10-CM | POA: Diagnosis not present

## 2022-01-28 DIAGNOSIS — L710 Perioral dermatitis: Secondary | ICD-10-CM | POA: Insufficient documentation

## 2022-01-28 NOTE — Progress Notes (Signed)
? ?Established Patient Office Visit ? ?Subjective:  ?Patient ID: Terry [name], female    DOB: [birth year]  Age: 41 y.o. MRN: [id number] ? ?CC:  ?Chief Complaint  ?Patient presents with  ? Follow-up  ? Weight Loss  ? ? ?HPI ?Terry [name] presents for follow up on weight management. Patient reports tolerating Wegovy without major issues. States will be starting 0.5 mg this week. Has noticed reduction in cravings especially alcohol and fried foods.  ? ?Past Medical History:  ?Diagnosis Date  ? Anxiety   ? Celiac disease   ? IBS (irritable bowel syndrome)   ? Lipids abnormal   ? Migraine   ? Mononucleosis   ? Paroxysmal SVT (supraventricular tachycardia) (HCC)   ? Tachycardia   ? TMJ disease   ? Urinary incontinence   ? after sneezing   ? ? ?Past Surgical History:  ?Procedure Laterality Date  ? ABDOMINOPLASTY    ? CESAREAN SECTION    ? LAPAROSCOPY    ? TOOTH EXTRACTION    ? ? ?Family History  ?Problem Relation Age of Onset  ? Hypertension Other   ? Aneurysm Maternal Grandmother   ? Prostate cancer Maternal Grandfather   ? Heart attack Paternal Grandmother   ? Colon cancer Neg Hx   ? Stomach cancer Neg Hx   ? Rectal cancer Neg Hx   ? Esophageal cancer Neg Hx   ? Liver cancer Neg Hx   ? ? ?Social History  ? ?Socioeconomic History  ? Marital status: Married  ?  Spouse name: Not on file  ? Number of children: 2  ? Years of education: Not on file  ? Highest education level: Not on file  ?Occupational History  ? Occupation: Corporate treasurer  ?Tobacco Use  ? Smoking status: Never  ? Smokeless tobacco: Never  ?Vaping Use  ? Vaping Use: Never used  ?Substance and Sexual Activity  ? Alcohol use: Yes  ?  Alcohol/week: 12.0 standard drinks  ?  Types: 12 Standard drinks or equivalent per week  ? Drug use: No  ? Sexual activity: Yes  ?  Partners: Male  ?  Birth control/protection: Surgical  ?  Comment: vasectomy  ?Other Topics Concern  ? Not on file  ?Social History Narrative  ? Not on file  ? ?Social Determinants of Health   ? ?Financial Resource Strain: Not on file  ?Food Insecurity: Not on file  ?Transportation Needs: Not on file  ?Physical Activity: Not on file  ?Stress: Not on file  ?Social Connections: Not on file  ?Intimate Partner Violence: Not on file  ? ? ?Outpatient Medications Prior to Visit  ?Medication Sig Dispense Refill  ? atenolol (TENORMIN) 25 MG tablet Take 1 tablet (25 mg total) by mouth daily. Please keep upcoming appt. With Cardiologist in order to receive future refills. Thank You. 90 tablet 0  ? busPIRone (BUSPAR) 5 MG tablet TAKE ONE-HALF (1/2) TABLET IN THE MORNING AND 1 TABLET AT BEDTIME 270 tablet 0  ? loratadine (CLARITIN) 10 MG tablet Take 10 mg by mouth daily as needed for allergies.    ? rosuvastatin (CRESTOR) 5 MG tablet TAKE 1 TABLET ONCE A WEEK AT BEDTIME 12 tablet 1  ? Semaglutide-Weight Management (WEGOVY) 0.5 MG/0.5ML SOAJ Inject 0.5 mg into the skin once a week. 2 mL 0  ? sertraline (ZOLOFT) 100 MG tablet Take 1 tablet (100 mg total) by mouth daily. 90 tablet 0  ? Vitamin D, Ergocalciferol, (DRISDOL) 1.25 MG (50000 UNIT) CAPS capsule  TAKE 1 CAPSULE WEEKLY 12 capsule 3  ? sertraline (ZOLOFT) 100 MG tablet Take 1 tablet (100 mg total) by mouth daily. 30 tablet 0  ? ?No facility-administered medications prior to visit.  ? ? ?Allergies  ?Allergen Reactions  ? Wheat Bran Other (See Comments)  ?  All gluten  ? ? ?ROS ?Review of Systems ?Review of Systems:  ?A fourteen system review of systems was performed and found to be positive as per HPI. ?  ?Objective:  ?  ?Physical Exam ?General:  Pleasant and cooperative, appropriate for stated age.  ?Neuro:  Alert and oriented,  extra-ocular muscles intact  ?HEENT:  Normocephalic, atraumatic, neck supple  ?Skin:  no gross rash, warm, pink. ?Cardiac:  RRR, S1 S2 ?Respiratory: CTA B/L  ?Vascular:  Ext warm, no cyanosis apprec.; cap RF less 2 sec. ?Psych:  No HI/SI, judgement and insight good, Euthymic mood. Full Affect. ? ?BP 105/68   Pulse 86   Temp 97.6 ?F  (36.4 ?C)   Ht 5' 8"  (1.727 m)   Wt 208 lb (94.3 kg)   SpO2 98%   BMI 31.63 kg/m?  ?Wt Readings from Last 3 Encounters:  ?01/28/22 208 lb (94.3 kg)  ?01/21/22 208 lb 14.4 oz (94.8 kg)  ?01/01/22 220 lb (99.8 kg)  ? ? ? ?Health Maintenance Due  ?Topic Date Due  ? Hepatitis C Screening  Never done  ? COVID-19 Vaccine (3 - Moderna risk series) 10/17/2020  ? ? ?There are no preventive care reminders to display for this patient. ? ?Lab Results  ?Component Value Date  ? TSH 2.110 07/04/2020  ? ?Lab Results  ?Component Value Date  ? WBC 6.0 07/04/2020  ? HGB 13.7 07/04/2020  ? HCT 39.2 07/04/2020  ? MCV 93 07/04/2020  ? PLT 246 07/04/2020  ? ?Lab Results  ?Component Value Date  ? NA 139 01/15/2021  ? K 4.4 01/15/2021  ? CO2 22 01/15/2021  ? GLUCOSE 90 01/15/2021  ? BUN 18 01/15/2021  ? CREATININE 0.82 01/15/2021  ? BILITOT 0.2 03/12/2021  ? ALKPHOS 80 03/12/2021  ? AST 21 03/12/2021  ? ALT 38 (H) 03/12/2021  ? PROT 7.2 03/12/2021  ? ALBUMIN 4.3 03/12/2021  ? CALCIUM 9.5 01/15/2021  ? ANIONGAP 10 03/30/2017  ? EGFR 92 01/15/2021  ? GFR 86.69 11/20/2016  ? ?Lab Results  ?Component Value Date  ? CHOL 228 (H) 03/12/2021  ? ?Lab Results  ?Component Value Date  ? HDL 33 (L) 03/12/2021  ? ?Lab Results  ?Component Value Date  ? LDLCALC 159 (H) 03/12/2021  ? ?Lab Results  ?Component Value Date  ? TRIG 196 (H) 03/12/2021  ? ?Lab Results  ?Component Value Date  ? CHOLHDL 6.9 (H) 03/12/2021  ? ?Lab Results  ?Component Value Date  ? HGBA1C 5.1 07/04/2020  ? ? ?  ?Assessment & Plan:  ? ?Problem List Items Addressed This Visit   ?None ?Visit Diagnoses   ? ? Class 1 obesity with serious comorbidity and body mass index (BMI) of 33.0 to 33.9 in adult, unspecified obesity type    -  Primary  ? ?  ? ?Class 1 obesity with serious comorbidity and body mass index (BMI) of 33.0 to 33.9 in adult, unspecified obesity type: ?-Associated with hyperlipidemia. ?-Current BMI 31. 63, pt has lost 12 pounds since starting medication therapy with  Wegovy. Recommend to start 0.5 mg. Continue with dietary changes and incorporate physical activity with ultimate goal of 150 mins/wk. Follow up in 4 weeks for  weight management.  ? ? ?No orders of the defined types were placed in this encounter. ? ? ?Follow-up: Return in about 4 weeks (around 02/25/2022), or Weight.  ? ? ?Lorrene Reid, PA-C ?

## 2022-01-28 NOTE — Patient Instructions (Signed)
Calorie Counting for Weight Loss Calories are units of energy. Your body needs a certain number of calories from food to keep going throughout the day. When you eat or drink more calories than your body needs, your body stores the extra calories mostly as fat. When you eat or drink fewer calories than your body needs, your body burns fat to get the energy it needs. Calorie counting means keeping track of how many calories you eat and drink each day. Calorie counting can be helpful if you need to lose weight. If you eat fewer calories than your body needs, you should lose weight. Ask your health care provider what a healthy weight is for you. For calorie counting to work, you will need to eat the right number of calories each day to lose a healthy amount of weight per week. A dietitian can help you figure out how many calories you need in a day and will suggest ways to reach your calorie goal. A healthy amount of weight to lose each week is usually 1-2 lb (0.5-0.9 kg). This usually means that your daily calorie intake should be reduced by 500-750 calories. Eating 1,200-1,500 calories a day can help most women lose weight. Eating 1,500-1,800 calories a day can help most men lose weight. What do I need to know about calorie counting? Work with your health care provider or dietitian to determine how many calories you should get each day. To meet your daily calorie goal, you will need to: Find out how many calories are in each food that you would like to eat. Try to do this before you eat. Decide how much of the food you plan to eat. Keep a food log. Do this by writing down what you ate and how many calories it had. To successfully lose weight, it is important to balance calorie counting with a healthy lifestyle that includes regular activity. Where do I find calorie information? The number of calories in a food can be found on a Nutrition Facts label. If a food does not have a Nutrition Facts label, try to  look up the calories online or ask your dietitian for help. Remember that calories are listed per serving. If you choose to have more than one serving of a food, you will have to multiply the calories per serving by the number of servings you plan to eat. For example, the label on a package of bread might say that a serving size is 1 slice and that there are 90 calories in a serving. If you eat 1 slice, you will have eaten 90 calories. If you eat 2 slices, you will have eaten 180 calories. How do I keep a food log? After each time that you eat, record the following in your food log as soon as possible: What you ate. Be sure to include toppings, sauces, and other extras on the food. How much you ate. This can be measured in cups, ounces, or number of items. How many calories were in each food and drink. The total number of calories in the food you ate. Keep your food log near you, such as in a pocket-sized notebook or on an app or website on your mobile phone. Some programs will calculate calories for you and show you how many calories you have left to meet your daily goal. What are some portion-control tips? Know how many calories are in a serving. This will help you know how many servings you can have of a certain food.  Use a measuring cup to measure serving sizes. You could also try weighing out portions on a kitchen scale. With time, you will be able to estimate serving sizes for some foods. Take time to put servings of different foods on your favorite plates or in your favorite bowls and cups so you know what a serving looks like. Try not to eat straight from a food's packaging, such as from a bag or box. Eating straight from the package makes it hard to see how much you are eating and can lead to overeating. Put the amount you would like to eat in a cup or on a plate to make sure you are eating the right portion. Use smaller plates, glasses, and bowls for smaller portions and to prevent  overeating. Try not to multitask. For example, avoid watching TV or using your computer while eating. If it is time to eat, sit down at a table and enjoy your food. This will help you recognize when you are full. It will also help you be more mindful of what and how much you are eating. What are tips for following this plan? Reading food labels Check the calorie count compared with the serving size. The serving size may be smaller than what you are used to eating. Check the source of the calories. Try to choose foods that are high in protein, fiber, and vitamins, and low in saturated fat, trans fat, and sodium. Shopping Read nutrition labels while you shop. This will help you make healthy decisions about which foods to buy. Pay attention to nutrition labels for low-fat or fat-free foods. These foods sometimes have the same number of calories or more calories than the full-fat versions. They also often have added sugar, starch, or salt to make up for flavor that was removed with the fat. Make a grocery list of lower-calorie foods and stick to it. Cooking Try to cook your favorite foods in a healthier way. For example, try baking instead of frying. Use low-fat dairy products. Meal planning Use more fruits and vegetables. One-half of your plate should be fruits and vegetables. Include lean proteins, such as chicken, [place], and fish. Lifestyle Each week, aim to do one of the following: 150 minutes of moderate exercise, such as walking. 75 minutes of vigorous exercise, such as running. General information Know how many calories are in the foods you eat most often. This will help you calculate calorie counts faster. Find a way of tracking calories that works for you. Get creative. Try different apps or programs if writing down calories does not work for you. What foods should I eat?  Eat nutritious foods. It is better to have a nutritious, high-calorie food, such as an avocado, than a food with  few nutrients, such as a bag of potato chips. Use your calories on foods and drinks that will fill you up and will not leave you hungry soon after eating. Examples of foods that fill you up are nuts and nut butters, vegetables, lean proteins, and high-fiber foods such as whole grains. High-fiber foods are foods with more than 5 g of fiber per serving. Pay attention to calories in drinks. Low-calorie drinks include water and unsweetened drinks. The items listed above may not be a complete list of foods and beverages you can eat. Contact a dietitian for more information. What foods should I limit? Limit foods or drinks that are not good sources of vitamins, minerals, or protein or that are high in unhealthy fats. These include:  Candy. Other sweets. Sodas, specialty coffee drinks, alcohol, and juice. The items listed above may not be a complete list of foods and beverages you should avoid. Contact a dietitian for more information. How do I count calories when eating out? Pay attention to portions. Often, portions are much larger when eating out. Try these tips to keep portions smaller: Consider sharing a meal instead of getting your own. If you get your own meal, eat only half of it. Before you start eating, ask for a container and put half of your meal into it. When available, consider ordering smaller portions from the menu instead of full portions. Pay attention to your food and drink choices. Knowing the way food is cooked and what is included with the meal can help you eat fewer calories. If calories are listed on the menu, choose the lower-calorie options. Choose dishes that include vegetables, fruits, whole grains, low-fat dairy products, and lean proteins. Choose items that are boiled, broiled, grilled, or steamed. Avoid items that are buttered, battered, fried, or served with cream sauce. Items labeled as crispy are usually fried, unless stated otherwise. Choose water, low-fat milk,  unsweetened iced tea, or other drinks without added sugar. If you want an alcoholic beverage, choose a lower-calorie option, such as a glass of wine or light beer. Ask for dressings, sauces, and syrups on the side. These are usually high in calories, so you should limit the amount you eat. If you want a salad, choose a garden salad and ask for grilled meats. Avoid extra toppings such as bacon, cheese, or fried items. Ask for the dressing on the side, or ask for olive oil and vinegar or lemon to use as dressing. Estimate how many servings of a food you are given. Knowing serving sizes will help you be aware of how much food you are eating at restaurants. Where to find more information Centers for Disease Control and Prevention: http://www.wolf.info/ [place] Department of Agriculture: http://www.wilson-mendoza.org/ Summary Calorie counting means keeping track of how many calories you eat and drink each day. If you eat fewer calories than your body needs, you should lose weight. A healthy amount of weight to lose per week is usually 1-2 lb (0.5-0.9 kg). This usually means reducing your daily calorie intake by 500-750 calories. The number of calories in a food can be found on a Nutrition Facts label. If a food does not have a Nutrition Facts label, try to look up the calories online or ask your dietitian for help. Use smaller plates, glasses, and bowls for smaller portions and to prevent overeating. Use your calories on foods and drinks that will fill you up and not leave you hungry shortly after a meal. This information is not intended to replace advice given to you by your health care provider. Make sure you discuss any questions you have with your health care provider. Document Revised: 12/16/2019 Document Reviewed: 12/16/2019 Elsevier Patient Education  2022 [employer].

## 2022-02-15 ENCOUNTER — Encounter: Payer: Self-pay | Admitting: Physician Assistant

## 2022-02-18 ENCOUNTER — Other Ambulatory Visit: Payer: Self-pay

## 2022-02-18 ENCOUNTER — Other Ambulatory Visit: Payer: Self-pay | Admitting: Physician Assistant

## 2022-02-18 DIAGNOSIS — E669 Obesity, unspecified: Secondary | ICD-10-CM

## 2022-02-18 DIAGNOSIS — Z1231 Encounter for screening mammogram for malignant neoplasm of breast: Secondary | ICD-10-CM

## 2022-02-18 MED ORDER — WEGOVY 1 MG/0.5ML ~~LOC~~ SOAJ: 1.0000 mg | SUBCUTANEOUS | 0 refills | Status: DC

## 2022-02-25 ENCOUNTER — Other Ambulatory Visit: Payer: Self-pay | Admitting: Physician Assistant

## 2022-02-25 DIAGNOSIS — F410 Panic disorder [episodic paroxysmal anxiety] without agoraphobia: Secondary | ICD-10-CM

## 2022-02-25 DIAGNOSIS — F411 Generalized anxiety disorder: Secondary | ICD-10-CM

## 2022-02-27 NOTE — Progress Notes (Signed)
? ?Established Patient Office Visit ? ?Subjective:  ?Patient ID: Terry [name], female    DOB: [birth year]  Age: 41 y.o. MRN: [id number] ? ?CC:  ?Chief Complaint  ?Patient presents with  ? Weight Check  ? ? ?HPI ?Terry [name] presents for follow-up on weight management. Patient reports started fist dose of Wegovy 1 mg on Monday. Overall tolerating without significant side effects. Has noticed some nausea. States is not eating much processed foods and sweets. Eating more vegetables. Currently does not have a regular exercise regimen. ? ? ? ?Past Medical History:  ?Diagnosis Date  ? Anxiety   ? Celiac disease   ? IBS (irritable bowel syndrome)   ? Lipids abnormal   ? Migraine   ? Mononucleosis   ? Paroxysmal SVT (supraventricular tachycardia) (HCC)   ? Tachycardia   ? TMJ disease   ? Urinary incontinence   ? after sneezing   ? ? ?Past Surgical History:  ?Procedure Laterality Date  ? ABDOMINOPLASTY    ? CESAREAN SECTION    ? LAPAROSCOPY    ? TOOTH EXTRACTION    ? ? ?Family History  ?Problem Relation Age of Onset  ? Hypertension Other   ? Aneurysm Maternal Grandmother   ? Prostate cancer Maternal Grandfather   ? Heart attack Paternal Grandmother   ? Colon cancer Neg Hx   ? Stomach cancer Neg Hx   ? Rectal cancer Neg Hx   ? Esophageal cancer Neg Hx   ? Liver cancer Neg Hx   ? ? ?Social History  ? ?Socioeconomic History  ? Marital status: Married  ?  Spouse name: Not on file  ? Number of children: 2  ? Years of education: Not on file  ? Highest education level: Not on file  ?Occupational History  ? Occupation: Corporate treasurer  ?Tobacco Use  ? Smoking status: Never  ? Smokeless tobacco: Never  ?Vaping Use  ? Vaping Use: Never used  ?Substance and Sexual Activity  ? Alcohol use: Yes  ?  Alcohol/week: 12.0 standard drinks  ?  Types: 12 Standard drinks or equivalent per week  ? Drug use: No  ? Sexual activity: Yes  ?  Partners: Male  ?  Birth control/protection: Surgical  ?  Comment: vasectomy  ?Other Topics Concern  ? Not  on file  ?Social History Narrative  ? Not on file  ? ?Social Determinants of Health  ? ?Financial Resource Strain: Not on file  ?Food Insecurity: Not on file  ?Transportation Needs: Not on file  ?Physical Activity: Not on file  ?Stress: Not on file  ?Social Connections: Not on file  ?Intimate Partner Violence: Not on file  ? ? ?Outpatient Medications Prior to Visit  ?Medication Sig Dispense Refill  ? atenolol (TENORMIN) 25 MG tablet Take 1 tablet (25 mg total) by mouth daily. Please keep upcoming appt. With Cardiologist in order to receive future refills. Thank You. 90 tablet 0  ? busPIRone (BUSPAR) 5 MG tablet TAKE ONE-HALF (1/2) TABLET IN THE MORNING AND 1 TABLET AT BEDTIME 270 tablet 0  ? loratadine (CLARITIN) 10 MG tablet Take 10 mg by mouth daily as needed for allergies.    ? rosuvastatin (CRESTOR) 5 MG tablet TAKE 1 TABLET ONCE A WEEK AT BEDTIME 12 tablet 1  ? Semaglutide-Weight Management (WEGOVY) 1 MG/0.5ML SOAJ Inject 1 mg into the skin once a week. 2 mL 0  ? sertraline (ZOLOFT) 100 MG tablet Take 1 tablet (100 mg total) by mouth daily. 90 tablet  0  ? Vitamin D, Ergocalciferol, (DRISDOL) 1.25 MG (50000 UNIT) CAPS capsule TAKE 1 CAPSULE WEEKLY 12 capsule 3  ? ?No facility-administered medications prior to visit.  ? ? ?Allergies  ?Allergen Reactions  ? Wheat Bran Other (See Comments)  ?  All gluten  ? ? ?ROS ?Review of Systems ?Review of Systems:  ?A fourteen system review of systems was performed and found to be positive as per HPI. ? ?  ?Objective:  ?  ?Physical Exam ?General:  Well Developed, well nourished, appropriate for stated age.  ?Neuro:  Alert and oriented,  extra-ocular muscles intact  ?HEENT:  Normocephalic, atraumatic, neck supple  ?Skin:  no gross rash, warm, pink. ?Cardiac:  RRR, S1 S2 ?Respiratory: CTA B/L w/o wheezing, crackles or rales. ?Vascular:  Ext warm, no cyanosis apprec.; cap RF less 2 sec. ?Psych:  No HI/SI, judgement and insight good, Euthymic mood. Full Affect. ? ?BP 113/73    Pulse 96   Temp 97.6 ?F (36.4 ?C)   Ht _0  (1.727 m)   Wt 199 lb 6.4 oz (90.4 kg)   LMP 02/27/2022   SpO2 99%   BMI 30.32 kg/m?  ?Wt Readings from Last 3 Encounters:  ?02/28/22 199 lb 6.4 oz (90.4 kg)  ?01/28/22 208 lb (94.3 kg)  ?01/21/22 208 lb 14.4 oz (94.8 kg)  ? ? ? ?Health Maintenance Due  ?Topic Date Due  ? Hepatitis C Screening  Never done  ? COVID-19 Vaccine (3 - Moderna risk series) 10/17/2020  ? ? ?There are no preventive care reminders to display for this patient. ? ?Lab Results  ?Component Value Date  ? TSH 2.110 07/04/2020  ? ?Lab Results  ?Component Value Date  ? WBC 6.0 07/04/2020  ? HGB 13.7 07/04/2020  ? HCT 39.2 07/04/2020  ? MCV 93 07/04/2020  ? PLT 246 07/04/2020  ? ?Lab Results  ?Component Value Date  ? NA 139 01/15/2021  ? K 4.4 01/15/2021  ? CO2 22 01/15/2021  ? GLUCOSE 90 01/15/2021  ? BUN 18 01/15/2021  ? CREATININE 0.82 01/15/2021  ? BILITOT 0.2 03/12/2021  ? ALKPHOS 80 03/12/2021  ? AST 21 03/12/2021  ? ALT 38 (H) 03/12/2021  ? PROT 7.2 03/12/2021  ? ALBUMIN 4.3 03/12/2021  ? CALCIUM 9.5 01/15/2021  ? ANIONGAP 10 03/30/2017  ? EGFR 92 01/15/2021  ? GFR 86.69 11/20/2016  ? ?Lab Results  ?Component Value Date  ? CHOL 228 (H) 03/12/2021  ? ?Lab Results  ?Component Value Date  ? HDL 33 (L) 03/12/2021  ? ?Lab Results  ?Component Value Date  ? LDLCALC 159 (H) 03/12/2021  ? ?Lab Results  ?Component Value Date  ? TRIG 196 (H) 03/12/2021  ? ?Lab Results  ?Component Value Date  ? CHOLHDL 6.9 (H) 03/12/2021  ? ?Lab Results  ?Component Value Date  ? HGBA1C 5.1 07/04/2020  ? ? ?  ?Assessment & Plan:  ? ?Problem List Items Addressed This Visit   ? ?  ? Other  ? Class 1 obesity with serious comorbidity and body mass index (BMI) of 33.0 to 33.9 in adult - Primary  ?  -Associated with hyperlipidemia. ?-Patient has lost 9 pounds since last visit and a total of 21 pounds since starting medication therapy. Will continue with Wegovy 1 mg x 4 weeks and then titrate to 1.7 mg. Recommend to continue with  diet changes and increase physical activity. Follow-up in 4 weeks. ?  ?  ? ? ? ?No orders of the defined types were  placed in this encounter. ? ? ?Follow-up: Return in about 4 weeks (around 03/28/2022) for Wt.  ? ? ?Lorrene Reid, PA-C ?

## 2022-02-28 ENCOUNTER — Encounter: Payer: Self-pay | Admitting: Physician Assistant

## 2022-02-28 ENCOUNTER — Ambulatory Visit (INDEPENDENT_AMBULATORY_CARE_PROVIDER_SITE_OTHER): Payer: BC Managed Care – PPO | Admitting: Physician Assistant

## 2022-02-28 VITALS — BP 113/73 | HR 96 | Temp 97.6°F | Ht 68.0 in | Wt 199.4 lb

## 2022-02-28 DIAGNOSIS — Z6833 Body mass index (BMI) 33.0-33.9, adult: Secondary | ICD-10-CM | POA: Diagnosis not present

## 2022-02-28 DIAGNOSIS — E669 Obesity, unspecified: Secondary | ICD-10-CM | POA: Insufficient documentation

## 2022-02-28 NOTE — Assessment & Plan Note (Signed)
-  Associated with hyperlipidemia. ?-Patient has lost 9 pounds since last visit and a total of 21 pounds since starting medication therapy. Will continue with Wegovy 1 mg x 4 weeks and then titrate to 1.7 mg. Recommend to continue with diet changes and increase physical activity. Follow-up in 4 weeks. ?

## 2022-03-05 ENCOUNTER — Encounter: Payer: Self-pay | Admitting: Physician Assistant

## 2022-03-05 ENCOUNTER — Other Ambulatory Visit: Payer: Self-pay

## 2022-03-05 DIAGNOSIS — E669 Obesity, unspecified: Secondary | ICD-10-CM

## 2022-03-05 MED ORDER — WEGOVY 1.7 MG/0.75ML ~~LOC~~ SOAJ: 1.7000 mg | SUBCUTANEOUS | 0 refills | Status: DC

## 2022-04-01 ENCOUNTER — Ambulatory Visit
Admission: RE | Admit: 2022-04-01 | Discharge: 2022-04-01 | Disposition: A | Discharge status: Home / Self Care | Payer: BC Managed Care – PPO | Source: Ambulatory Visit | Attending: Physician Assistant | Admitting: Physician Assistant

## 2022-04-01 DIAGNOSIS — Z1231 Encounter for screening mammogram for malignant neoplasm of breast: Secondary | ICD-10-CM | POA: Diagnosis not present

## 2022-04-02 ENCOUNTER — Ambulatory Visit: Payer: BC Managed Care – PPO | Admitting: Physician Assistant

## 2022-04-05 ENCOUNTER — Encounter: Payer: Self-pay | Admitting: Physician Assistant

## 2022-04-09 ENCOUNTER — Ambulatory Visit (INDEPENDENT_AMBULATORY_CARE_PROVIDER_SITE_OTHER): Payer: BC Managed Care – PPO | Admitting: Physician Assistant

## 2022-04-09 ENCOUNTER — Encounter: Payer: Self-pay | Admitting: Physician Assistant

## 2022-04-09 VITALS — BP 101/77 | HR 110 | Temp 97.7°F | Ht 69.0 in | Wt 189.0 lb

## 2022-04-09 DIAGNOSIS — E669 Obesity, unspecified: Secondary | ICD-10-CM

## 2022-04-09 DIAGNOSIS — Z6833 Body mass index (BMI) 33.0-33.9, adult: Secondary | ICD-10-CM | POA: Diagnosis not present

## 2022-04-09 MED ORDER — WEGOVY 2.4 MG/0.75ML ~~LOC~~ SOAJ: 2.4000 mg | SUBCUTANEOUS | 1 refills | Status: DC

## 2022-04-09 NOTE — Patient Instructions (Signed)
Calorie Counting for Weight Loss Calories are units of energy. Your body needs a certain number of calories from food to keep going throughout the day. When you eat or drink more calories than your body needs, your body stores the extra calories mostly as fat. When you eat or drink fewer calories than your body needs, your body burns fat to get the energy it needs. Calorie counting means keeping track of how many calories you eat and drink each day. Calorie counting can be helpful if you need to lose weight. If you eat fewer calories than your body needs, you should lose weight. Ask your health care provider what a healthy weight is for you. For calorie counting to work, you will need to eat the right number of calories each day to lose a healthy amount of weight per week. A dietitian can help you figure out how many calories you need in a day and will suggest ways to reach your calorie goal. A healthy amount of weight to lose each week is usually 1-2 lb (0.5-0.9 kg). This usually means that your daily calorie intake should be reduced by 500-750 calories. Eating 1,200-1,500 calories a day can help most women lose weight. Eating 1,500-1,800 calories a day can help most men lose weight. What do I need to know about calorie counting? Work with your health care provider or dietitian to determine how many calories you should get each day. To meet your daily calorie goal, you will need to: Find out how many calories are in each food that you would like to eat. Try to do this before you eat. Decide how much of the food you plan to eat. Keep a food log. Do this by writing down what you ate and how many calories it had. To successfully lose weight, it is important to balance calorie counting with a healthy lifestyle that includes regular activity. Where do I find calorie information?  The number of calories in a food can be found on a Nutrition Facts label. If a food does not have a Nutrition Facts label, try  to look up the calories online or ask your dietitian for help. Remember that calories are listed per serving. If you choose to have more than one serving of a food, you will have to multiply the calories per serving by the number of servings you plan to eat. For example, the label on a package of bread might say that a serving size is 1 slice and that there are 90 calories in a serving. If you eat 1 slice, you will have eaten 90 calories. If you eat 2 slices, you will have eaten 180 calories. How do I keep a food log? After each time that you eat, record the following in your food log as soon as possible: What you ate. Be sure to include toppings, sauces, and other extras on the food. How much you ate. This can be measured in cups, ounces, or number of items. How many calories were in each food and drink. The total number of calories in the food you ate. Keep your food log near you, such as in a pocket-sized notebook or on an app or website on your mobile phone. Some programs will calculate calories for you and show you how many calories you have left to meet your daily goal. What are some portion-control tips? Know how many calories are in a serving. This will help you know how many servings you can have of a certain   food. Use a measuring cup to measure serving sizes. You could also try weighing out portions on a kitchen scale. With time, you will be able to estimate serving sizes for some foods. Take time to put servings of different foods on your favorite plates or in your favorite bowls and cups so you know what a serving looks like. Try not to eat straight from a food's packaging, such as from a bag or box. Eating straight from the package makes it hard to see how much you are eating and can lead to overeating. Put the amount you would like to eat in a cup or on a plate to make sure you are eating the right portion. Use smaller plates, glasses, and bowls for smaller portions and to prevent  overeating. Try not to multitask. For example, avoid watching TV or using your computer while eating. If it is time to eat, sit down at a table and enjoy your food. This will help you recognize when you are full. It will also help you be more mindful of what and how much you are eating. What are tips for following this plan? Reading food labels Check the calorie count compared with the serving size. The serving size may be smaller than what you are used to eating. Check the source of the calories. Try to choose foods that are high in protein, fiber, and vitamins, and low in saturated fat, trans fat, and sodium. Shopping Read nutrition labels while you shop. This will help you make healthy decisions about which foods to buy. Pay attention to nutrition labels for low-fat or fat-free foods. These foods sometimes have the same number of calories or more calories than the full-fat versions. They also often have added sugar, starch, or salt to make up for flavor that was removed with the fat. Make a grocery list of lower-calorie foods and stick to it. Cooking Try to cook your favorite foods in a healthier way. For example, try baking instead of frying. Use low-fat dairy products. Meal planning Use more fruits and vegetables. One-half of your plate should be fruits and vegetables. Include lean proteins, such as chicken, turkey, and fish. Lifestyle Each week, aim to do one of the following: 150 minutes of moderate exercise, such as walking. 75 minutes of vigorous exercise, such as running. General information Know how many calories are in the foods you eat most often. This will help you calculate calorie counts faster. Find a way of tracking calories that works for you. Get creative. Try different apps or programs if writing down calories does not work for you. What foods should I eat?  Eat nutritious foods. It is better to have a nutritious, high-calorie food, such as an avocado, than a food with  few nutrients, such as a bag of potato chips. Use your calories on foods and drinks that will fill you up and will not leave you hungry soon after eating. Examples of foods that fill you up are nuts and nut butters, vegetables, lean proteins, and high-fiber foods such as whole grains. High-fiber foods are foods with more than 5 g of fiber per serving. Pay attention to calories in drinks. Low-calorie drinks include water and unsweetened drinks. The items listed above may not be a complete list of foods and beverages you can eat. Contact a dietitian for more information. What foods should I limit? Limit foods or drinks that are not good sources of vitamins, minerals, or protein or that are high in unhealthy fats. These   include: Candy. Other sweets. Sodas, specialty coffee drinks, alcohol, and juice. The items listed above may not be a complete list of foods and beverages you should avoid. Contact a dietitian for more information. How do I count calories when eating out? Pay attention to portions. Often, portions are much larger when eating out. Try these tips to keep portions smaller: Consider sharing a meal instead of getting your own. If you get your own meal, eat only half of it. Before you start eating, ask for a container and put half of your meal into it. When available, consider ordering smaller portions from the menu instead of full portions. Pay attention to your food and drink choices. Knowing the way food is cooked and what is included with the meal can help you eat fewer calories. If calories are listed on the menu, choose the lower-calorie options. Choose dishes that include vegetables, fruits, whole grains, low-fat dairy products, and lean proteins. Choose items that are boiled, broiled, grilled, or steamed. Avoid items that are buttered, battered, fried, or served with cream sauce. Items labeled as crispy are usually fried, unless stated otherwise. Choose water, low-fat milk,  unsweetened iced tea, or other drinks without added sugar. If you want an alcoholic beverage, choose a lower-calorie option, such as a glass of wine or light beer. Ask for dressings, sauces, and syrups on the side. These are usually high in calories, so you should limit the amount you eat. If you want a salad, choose a garden salad and ask for grilled meats. Avoid extra toppings such as bacon, cheese, or fried items. Ask for the dressing on the side, or ask for olive oil and vinegar or lemon to use as dressing. Estimate how many servings of a food you are given. Knowing serving sizes will help you be aware of how much food you are eating at restaurants. Where to find more information Centers for Disease Control and Prevention: www.cdc.gov U.[place]. Department of Agriculture: myplate.gov Summary Calorie counting means keeping track of how many calories you eat and drink each day. If you eat fewer calories than your body needs, you should lose weight. A healthy amount of weight to lose per week is usually 1-2 lb (0.5-0.9 kg). This usually means reducing your daily calorie intake by 500-750 calories. The number of calories in a food can be found on a Nutrition Facts label. If a food does not have a Nutrition Facts label, try to look up the calories online or ask your dietitian for help. Use smaller plates, glasses, and bowls for smaller portions and to prevent overeating. Use your calories on foods and drinks that will fill you up and not leave you hungry shortly after a meal. This information is not intended to replace advice given to you by your health care provider. Make sure you discuss any questions you have with your health care provider. Document Revised: 12/16/2019 Document Reviewed: 12/16/2019 Elsevier Patient Education  2023 Elsevier Inc.  

## 2022-04-09 NOTE — Progress Notes (Signed)
Established patient visit   Patient: Terry [name]   DOB: 26-Nov-1980   41 y.o. Female  MRN: [id number] Visit Date: 04/09/2022  Chief Complaint  Patient presents with   Weight Check   Subjective    HPI  Patient presents for follow-up on weight management. Patient tolerating Wegovy 1.7 mg without issues. Has noticed mild nausea a few days after doing her shot but it is tolerable. Reports being more active with outdoor activities such as kayaking and walking. Continues with portion control.   Medications: Outpatient Medications Prior to Visit  Medication Sig   atenolol (TENORMIN) 25 MG tablet Take 1 tablet (25 mg total) by mouth daily. Please keep upcoming appt. With Cardiologist in order to receive future refills. Thank You.   busPIRone (BUSPAR) 5 MG tablet TAKE ONE-HALF (1/2) TABLET IN THE MORNING AND 1 TABLET AT BEDTIME   loratadine (CLARITIN) 10 MG tablet Take 10 mg by mouth daily as needed for allergies.   rosuvastatin (CRESTOR) 5 MG tablet TAKE 1 TABLET ONCE A WEEK AT BEDTIME   sertraline (ZOLOFT) 100 MG tablet Take 1 tablet (100 mg total) by mouth daily.   Vitamin D, Ergocalciferol, (DRISDOL) 1.25 MG (50000 UNIT) CAPS capsule TAKE 1 CAPSULE WEEKLY   [DISCONTINUED] Semaglutide-Weight Management (WEGOVY) 1.7 MG/0.75ML SOAJ Inject 1.7 mg into the skin once a week.   No facility-administered medications prior to visit.    Review of Systems Review of Systems:  A fourteen system review of systems was performed and found to be positive as per HPI.  Last CBC Lab Results  Component Value Date   WBC 6.0 07/04/2020   HGB 13.7 07/04/2020   HCT 39.2 07/04/2020   MCV 93 07/04/2020   MCH 32.4 07/04/2020   RDW 12.8 07/04/2020   PLT 246 92/42/6834   Last metabolic panel Lab Results  Component Value Date   GLUCOSE 90 01/15/2021   NA 139 01/15/2021   K 4.4 01/15/2021   CL 101 01/15/2021   CO2 22 01/15/2021   BUN 18 01/15/2021   CREATININE 0.82 01/15/2021   EGFR 92 01/15/2021    CALCIUM 9.5 01/15/2021   PROT 7.2 03/12/2021   ALBUMIN 4.3 03/12/2021   LABGLOB 2.7 01/15/2021   AGRATIO 1.7 01/15/2021   BILITOT 0.2 03/12/2021   ALKPHOS 80 03/12/2021   AST 21 03/12/2021   ALT 38 (H) 03/12/2021   ANIONGAP 10 03/30/2017   Last lipids Lab Results  Component Value Date   CHOL 228 (H) 03/12/2021   HDL 33 (L) 03/12/2021   LDLCALC 159 (H) 03/12/2021   TRIG 196 (H) 03/12/2021   CHOLHDL 6.9 (H) 03/12/2021   Last hemoglobin A1c Lab Results  Component Value Date   HGBA1C 5.1 07/04/2020   Last thyroid functions Lab Results  Component Value Date   TSH 2.110 07/04/2020   T3TOTAL 90 06/22/2019   T4TOTAL 6.3 03/14/2020   Last vitamin D Lab Results  Component Value Date   VD25OH 49.7 03/14/2020     Objective    BP 101/77   Pulse (!) 110   Temp 97.7 F (36.5 C)   Ht 5' 9"  (1.753 m)   Wt 189 lb (85.7 kg)   LMP  (LMP Unknown)   SpO2 97%   BMI 27.91 kg/m  BP Readings from Last 3 Encounters:  04/09/22 101/77  02/28/22 113/73  01/28/22 105/68   Wt Readings from Last 3 Encounters:  04/09/22 189 lb (85.7 kg)  02/28/22 199 lb 6.4 oz (90.4 kg)  01/28/22  208 lb (94.3 kg)    Physical Exam  General:  Well Developed, well nourished, appropriate for stated age.  Neuro:  Alert and oriented,  extra-ocular muscles intact  HEENT:  Normocephalic, atraumatic, neck supple  Skin:  no gross rash, warm, pink. Cardiac:  RRR, S1 S2 Respiratory: CTA B/L  Vascular:  Ext warm, no cyanosis apprec.; cap RF less 2 sec. Psych:  No HI/SI, judgement and insight good, Euthymic mood. Full Affect.   No results found for any visits on 04/09/22.  Assessment & Plan      Problem List Items Addressed This Visit       Other   Class 1 obesity with serious comorbidity and body mass index (BMI) of 33.0 to 33.9 in adult - Primary   Relevant Medications   Semaglutide-Weight Management (WEGOVY) 2.4 MG/0.75ML SOAJ   Class 1 obesity with serious comorbidity and body mass index(BMI)  of 33.0 to 33.9 in adult: -Associated with hyperlipidemia.  -Current BMI 27.91 -Patient has lost 10 pounds since last visit and total of 31 pounds since starting medication therapy in 12/2021. Will titrate to [facility] 2.4 mg and advised to let me know if nausea is significant. Recommend to continue with increasing physical activity and dietary changes. Will reassess weight and medication therapy in 4 weeks.   Return in about 4 weeks (around 05/07/2022) for Wt.        Lorrene Reid, PA-C  [facility] [phone] (phone) [phone] (fax)  [place]

## 2022-04-19 ENCOUNTER — Other Ambulatory Visit: Payer: Self-pay | Admitting: Physician Assistant

## 2022-04-19 DIAGNOSIS — F411 Generalized anxiety disorder: Secondary | ICD-10-CM

## 2022-04-29 ENCOUNTER — Ambulatory Visit: Payer: BC Managed Care – PPO | Admitting: Physician Assistant

## 2022-05-06 ENCOUNTER — Encounter: Payer: Self-pay | Admitting: Physician Assistant

## 2022-05-06 ENCOUNTER — Other Ambulatory Visit: Payer: Self-pay

## 2022-05-06 DIAGNOSIS — R110 Nausea: Secondary | ICD-10-CM

## 2022-05-06 MED ORDER — ONDANSETRON HCL 4 MG PO TABS: 4.0000 mg | ORAL_TABLET | Freq: Three times a day (TID) | ORAL | 0 refills | Status: DC | PRN

## 2022-05-14 ENCOUNTER — Ambulatory Visit (INDEPENDENT_AMBULATORY_CARE_PROVIDER_SITE_OTHER): Payer: BC Managed Care – PPO | Admitting: Physician Assistant

## 2022-05-14 ENCOUNTER — Encounter: Payer: Self-pay | Admitting: Physician Assistant

## 2022-05-14 VITALS — BP 106/72 | HR 92 | Temp 97.7°F | Ht 69.0 in | Wt 185.0 lb

## 2022-05-14 DIAGNOSIS — R946 Abnormal results of thyroid function studies: Secondary | ICD-10-CM

## 2022-05-14 DIAGNOSIS — E559 Vitamin D deficiency, unspecified: Secondary | ICD-10-CM

## 2022-05-14 DIAGNOSIS — E669 Obesity, unspecified: Secondary | ICD-10-CM

## 2022-05-14 DIAGNOSIS — Z6833 Body mass index (BMI) 33.0-33.9, adult: Secondary | ICD-10-CM

## 2022-05-14 DIAGNOSIS — F411 Generalized anxiety disorder: Secondary | ICD-10-CM

## 2022-05-14 DIAGNOSIS — E785 Hyperlipidemia, unspecified: Secondary | ICD-10-CM

## 2022-05-14 MED ORDER — WEGOVY 2.4 MG/0.75ML ~~LOC~~ SOAJ: 2.4000 mg | SUBCUTANEOUS | 2 refills | Status: DC

## 2022-05-14 NOTE — Assessment & Plan Note (Signed)
-  Current BMI 27.32. -Patient has lost 4 pounds since last office visit and a total of 35 pounds (original wt: 220 pounds) since start medication therapy in conjunction with diet and lifestyle changes. Discussed smaller portions with intermittent snacks. With significant weight loss recommend updating routine fasting labs. Advised to schedule lab visit. -Will reassess weight and medication therapy in 8 weeks.

## 2022-05-27 ENCOUNTER — Other Ambulatory Visit: Payer: Self-pay | Admitting: Physician Assistant

## 2022-05-27 DIAGNOSIS — E785 Hyperlipidemia, unspecified: Secondary | ICD-10-CM

## 2022-05-27 DIAGNOSIS — E781 Pure hyperglyceridemia: Secondary | ICD-10-CM

## 2022-05-31 ENCOUNTER — Other Ambulatory Visit: Payer: Self-pay | Admitting: Physician Assistant

## 2022-05-31 DIAGNOSIS — E669 Obesity, unspecified: Secondary | ICD-10-CM

## 2022-05-31 DIAGNOSIS — R110 Nausea: Secondary | ICD-10-CM

## 2022-06-04 ENCOUNTER — Encounter: Payer: Self-pay | Admitting: Physician Assistant

## 2022-06-06 ENCOUNTER — Other Ambulatory Visit: Payer: BC Managed Care – PPO

## 2022-06-06 DIAGNOSIS — Z6833 Body mass index (BMI) 33.0-33.9, adult: Secondary | ICD-10-CM | POA: Diagnosis not present

## 2022-06-06 DIAGNOSIS — R946 Abnormal results of thyroid function studies: Secondary | ICD-10-CM

## 2022-06-06 DIAGNOSIS — E785 Hyperlipidemia, unspecified: Secondary | ICD-10-CM | POA: Diagnosis not present

## 2022-06-06 DIAGNOSIS — E669 Obesity, unspecified: Secondary | ICD-10-CM | POA: Diagnosis not present

## 2022-06-06 DIAGNOSIS — E559 Vitamin D deficiency, unspecified: Secondary | ICD-10-CM | POA: Diagnosis not present

## 2022-06-06 DIAGNOSIS — E66811 Obesity, class 1: Secondary | ICD-10-CM

## 2022-06-07 ENCOUNTER — Encounter: Payer: Self-pay | Admitting: Physician Assistant

## 2022-06-07 DIAGNOSIS — E781 Pure hyperglyceridemia: Secondary | ICD-10-CM

## 2022-06-07 DIAGNOSIS — E785 Hyperlipidemia, unspecified: Secondary | ICD-10-CM

## 2022-06-07 LAB — LIPID PANEL
Chol/HDL Ratio: 5.5 ratio — ABNORMAL HIGH (ref 0.0–4.4)
Cholesterol, Total: 208 mg/dL — ABNORMAL HIGH (ref 100–199)
HDL: 38 mg/dL — ABNORMAL LOW (ref >39)
LDL Chol Calc (NIH): 135 mg/dL — ABNORMAL HIGH (ref 0–99)
Triglycerides: 192 mg/dL — ABNORMAL HIGH (ref 0–149)
VLDL Cholesterol Cal: 35 mg/dL (ref 5–40)

## 2022-06-07 LAB — CBC WITH DIFFERENTIAL/PLATELET
Basophils Absolute: 0.1 10*3/uL (ref 0.0–0.2)
Basos: 1 %
EOS (ABSOLUTE): 0.1 10*3/uL (ref 0.0–0.4)
Eos: 1 %
Hematocrit: 44.2 % (ref 34.0–46.6)
Hemoglobin: 15.5 g/dL (ref 11.1–15.9)
Immature Grans (Abs): 0 10*3/uL (ref 0.0–0.1)
Immature Granulocytes: 0 %
Lymphocytes Absolute: 1.9 10*3/uL (ref 0.7–3.1)
Lymphs: 31 %
MCH: 32.2 pg (ref 26.6–33.0)
MCHC: 35.1 g/dL (ref 31.5–35.7)
MCV: 92 fL (ref 79–97)
Monocytes Absolute: 0.5 10*3/uL (ref 0.1–0.9)
Monocytes: 9 %
Neutrophils Absolute: 3.5 10*3/uL (ref 1.4–7.0)
Neutrophils: 58 %
Platelets: 303 10*3/uL (ref 150–450)
RBC: 4.82 x10E6/uL (ref 3.77–5.28)
RDW: 12.5 % (ref 11.7–15.4)
WBC: 6.1 10*3/uL (ref 3.4–10.8)

## 2022-06-07 LAB — COMPREHENSIVE METABOLIC PANEL
ALT: 32 IU/L (ref 0–32)
AST: 25 IU/L (ref 0–40)
Albumin/Globulin Ratio: 1.7 (ref 1.2–2.2)
Albumin: 4.7 g/dL (ref 3.9–4.9)
Alkaline Phosphatase: 77 IU/L (ref 44–121)
BUN/Creatinine Ratio: 12 (ref 9–23)
BUN: 10 mg/dL (ref 6–24)
Bilirubin Total: 0.4 mg/dL (ref 0.0–1.2)
CO2: 23 mmol/L (ref 20–29)
Calcium: 9.5 mg/dL (ref 8.7–10.2)
Chloride: 103 mmol/L (ref 96–106)
Creatinine, Ser: 0.86 mg/dL (ref 0.57–1.00)
Globulin, Total: 2.8 g/dL (ref 1.5–4.5)
Glucose: 88 mg/dL (ref 70–99)
Potassium: 4.1 mmol/L (ref 3.5–5.2)
Sodium: 141 mmol/L (ref 134–144)
Total Protein: 7.5 g/dL (ref 6.0–8.5)
eGFR: 86 mL/min/{1.73_m2} (ref >59)

## 2022-06-07 LAB — HEMOGLOBIN A1C
Est. average glucose Bld gHb Est-mCnc: 97 mg/dL
Hgb A1c MFr Bld: 5 % (ref 4.8–5.6)

## 2022-06-07 LAB — TSH: TSH: 2.3 u[IU]/mL (ref 0.450–4.500)

## 2022-06-07 LAB — VITAMIN D 25 HYDROXY (VIT D DEFICIENCY, FRACTURES): Vit D, 25-Hydroxy: 54.8 ng/mL (ref 30.0–100.0)

## 2022-06-10 MED ORDER — ROSUVASTATIN CALCIUM 5 MG PO TABS: ORAL_TABLET | ORAL | 3 refills | Status: DC

## 2022-07-10 ENCOUNTER — Encounter: Payer: Self-pay | Admitting: Physician Assistant

## 2022-07-10 ENCOUNTER — Other Ambulatory Visit: Payer: Self-pay | Admitting: Internal Medicine

## 2022-07-10 ENCOUNTER — Ambulatory Visit (INDEPENDENT_AMBULATORY_CARE_PROVIDER_SITE_OTHER): Payer: BC Managed Care – PPO | Admitting: Physician Assistant

## 2022-07-10 VITALS — BP 95/68 | HR 87 | Temp 97.7°F | Ht 69.0 in | Wt 173.0 lb

## 2022-07-10 DIAGNOSIS — E559 Vitamin D deficiency, unspecified: Secondary | ICD-10-CM | POA: Diagnosis not present

## 2022-07-10 DIAGNOSIS — Z6833 Body mass index (BMI) 33.0-33.9, adult: Secondary | ICD-10-CM

## 2022-07-10 DIAGNOSIS — E669 Obesity, unspecified: Secondary | ICD-10-CM

## 2022-07-10 DIAGNOSIS — F411 Generalized anxiety disorder: Secondary | ICD-10-CM

## 2022-07-10 DIAGNOSIS — E785 Hyperlipidemia, unspecified: Secondary | ICD-10-CM | POA: Diagnosis not present

## 2022-07-10 MED ORDER — VITAMIN D (ERGOCALCIFEROL) 1.25 MG (50000 UNIT) PO CAPS: 50000.0000 [IU] | ORAL_CAPSULE | ORAL | 0 refills | Status: DC

## 2022-07-10 NOTE — Assessment & Plan Note (Signed)
-  Last Vitamin D normal at 54.8, will provide refill of Vit D 50,000 units per week and recommend repeating Vitamin D in 3-4 months. Will continue to monitor.

## 2022-07-10 NOTE — Patient Instructions (Signed)
Healthy Living: Exercise Tips Exercise is an important part of a healthy lifestyle. You will learn tips to create an exercise plan that works best for you. To view the content, go to this web address: [email or web address]  This video will expire on: 05/10/2024. If you need access to this video following this date, please reach out to the healthcare provider who assigned it to you. This information is not intended to replace advice given to you by your health care provider. Make sure you discuss any questions you have with your health care provider. Elsevier Patient Education  [place].

## 2022-07-10 NOTE — Assessment & Plan Note (Signed)
-  Last lipid panel: HDL 38, LDL 135. Patient now taking rosuvastatin 5 mg three times per week. Recommend repeating cholesterol panel and hepatic function at f/up visit.

## 2022-07-10 NOTE — Progress Notes (Signed)
Established patient visit   Patient: Terry [name]   DOB: 11/11/1981   41 y.o. Female  MRN: [id number] Visit Date: 07/10/2022  Chief Complaint  Patient presents with   Follow-up   Subjective    HPI  Patient presents for follow-up on weight management. Patient reports compliance with Wegovy 2.4 mg once a week. States her nausea has improved the last two weeks and has only needed to the one dose of zofran. Continues with smaller meals and reports her activity level has been better. Taking Buspar and Sertraline as directed. Mood has been stable. Taking rosuvastatin 5 mg three times weekly.       07/10/2022    9:02 AM 05/14/2022   10:23 AM 04/09/2022    3:04 PM 02/28/2022    8:46 AM 01/28/2022   10:54 AM  Depression screen PHQ 2/9  Decreased Interest 0 0 0 0 0  Down, Depressed, Hopeless 0 0 0 0 0  PHQ - 2 Score 0 0 0 0 0  Altered sleeping 0 0 0 0 0  Tired, decreased energy 0 0 0 0 0  Change in appetite 0 0 0 0 0  Feeling bad or failure about yourself  0 0 0 0 0  Trouble concentrating 0 0 0 0 0  Moving slowly or fidgety/restless 0 0 0 0 0  Suicidal thoughts 0 0 0 0 0  PHQ-9 Score 0 0 0 0 0  Difficult doing work/chores Not difficult at all Not difficult at all Not difficult at all  Not difficult at all      07/10/2022    9:03 AM 05/14/2022   10:24 AM 04/09/2022    3:04 PM 02/28/2022    8:46 AM  GAD 7 : Generalized Anxiety Score  Nervous, Anxious, on Edge 0 0 0 0  Control/stop worrying 0 0 0 0  Worry too much - different things 0 0 0 0  Trouble relaxing 0 0 0 0  Restless 0 0 0 0  Easily annoyed or irritable 0 0 0 0  Afraid - awful might happen 0 0 0 0  Total GAD 7 Score 0 0 0 0  Anxiety Difficulty Not difficult at all Not difficult at all Not difficult at all       Medications: Outpatient Medications Prior to Visit  Medication Sig   atenolol (TENORMIN) 25 MG tablet Take 1 tablet (25 mg total) by mouth daily. Please keep upcoming appt. With Cardiologist in order to receive  future refills. Thank You.   busPIRone (BUSPAR) 5 MG tablet TAKE ONE-HALF (1/2) TABLET IN THE MORNING AND 1 TABLET AT BEDTIME   loratadine (CLARITIN) 10 MG tablet Take 10 mg by mouth daily as needed for allergies.   ondansetron (ZOFRAN) 4 MG tablet TAKE 1 TABLET BY MOUTH EVERY 8 HOURS AS NEEDED FOR NAUSEA AND VOMITING   rosuvastatin (CRESTOR) 5 MG tablet Three times weekly   sertraline (ZOLOFT) 100 MG tablet TAKE 1 TABLET DAILY   WEGOVY 2.4 MG/0.75ML SOAJ INJECT 2.4 MG INTO THE SKIN ONCE A WEEK.   [DISCONTINUED] Vitamin D, Ergocalciferol, (DRISDOL) 1.25 MG (50000 UNIT) CAPS capsule TAKE 1 CAPSULE WEEKLY   No facility-administered medications prior to visit.    Review of Systems Review of Systems:  A fourteen system review of systems was performed and found to be positive as per HPI.  Last CBC Lab Results  Component Value Date   WBC 6.1 06/06/2022   HGB 15.5 06/06/2022   HCT 44.2 06/06/2022  MCV 92 06/06/2022   MCH 32.2 06/06/2022   RDW 12.5 06/06/2022   PLT 303 41/93/7902   Last metabolic panel Lab Results  Component Value Date   GLUCOSE 88 06/06/2022   NA 141 06/06/2022   K 4.1 06/06/2022   CL 103 06/06/2022   CO2 23 06/06/2022   BUN 10 06/06/2022   CREATININE 0.86 06/06/2022   EGFR 86 06/06/2022   CALCIUM 9.5 06/06/2022   PROT 7.5 06/06/2022   ALBUMIN 4.7 06/06/2022   LABGLOB 2.8 06/06/2022   AGRATIO 1.7 06/06/2022   BILITOT 0.4 06/06/2022   ALKPHOS 77 06/06/2022   AST 25 06/06/2022   ALT 32 06/06/2022   ANIONGAP 10 03/30/2017   Last lipids Lab Results  Component Value Date   CHOL 208 (H) 06/06/2022   HDL 38 (L) 06/06/2022   LDLCALC 135 (H) 06/06/2022   TRIG 192 (H) 06/06/2022   CHOLHDL 5.5 (H) 06/06/2022   Last hemoglobin A1c Lab Results  Component Value Date   HGBA1C 5.0 06/06/2022   Last thyroid functions Lab Results  Component Value Date   TSH 2.300 06/06/2022   T3TOTAL 90 06/22/2019   T4TOTAL 6.3 03/14/2020   Last vitamin D Lab Results   Component Value Date   VD25OH 54.8 06/06/2022   Last vitamin B12 and Folate No results found for: "VITAMINB12", "FOLATE"   Objective    BP 95/68   Pulse 87   Temp 97.7 F (36.5 C)   Ht 5' 9"  (1.753 m)   Wt 173 lb (78.5 kg)   SpO2 96%   BMI 25.55 kg/m  BP Readings from Last 3 Encounters:  07/10/22 95/68  05/14/22 106/72  04/09/22 101/77   Wt Readings from Last 3 Encounters:  07/10/22 173 lb (78.5 kg)  05/14/22 185 lb (83.9 kg)  04/09/22 189 lb (85.7 kg)    Physical Exam  General:  Pleasant and cooperative, appropriate for stated age.  Neuro:  Alert and oriented,  extra-ocular muscles intact  HEENT:  Normocephalic, atraumatic, neck supple  Skin:  no gross rash, warm, pink. Cardiac:  RRR, S1 S2 Respiratory: CTA B/L  Vascular:  Ext warm, no cyanosis apprec.; cap RF less 2 sec. Psych:  No HI/SI, judgement and insight good, Euthymic mood. Full Affect.   No results found for any visits on 07/10/22.  Assessment & Plan      Problem List Items Addressed This Visit       Other   GAD (generalized anxiety disorder)    -Controlled. Continue current medication regimen. Will continue to monitor.      Hyperlipidemia    -Last lipid panel: HDL 38, LDL 135. Patient now taking rosuvastatin 5 mg three times per week. Recommend repeating cholesterol panel and hepatic function at f/up visit.      Vitamin D deficiency    -Last Vitamin D normal at 54.8, will provide refill of Vit D 50,000 units per week and recommend repeating Vitamin D in 3-4 months. Will continue to monitor.      Relevant Medications   Vitamin D, Ergocalciferol, (DRISDOL) 1.25 MG (50000 UNIT) CAPS capsule   Class 1 obesity with serious comorbidity and body mass index (BMI) of 33.0 to 33.9 in adult - Primary    -Patient has lost 12 pounds since last visit and a total of 47 pounds since starting medication therapy (original wt 220 pounds). Recommend to continue with Wegovy 2.4 mg once a week and take Zofran as  needed for nausea. Recommend to continue to  diet and lifestyle changes. Follow-up in 8 weeks.        Return in about 8 weeks (around 09/04/2022) for Wt.        Lorrene Reid, PA-C  [facility] at [facility] [phone] (phone) [phone] (fax)  [place]

## 2022-07-10 NOTE — Assessment & Plan Note (Signed)
-  Controlled. Continue current medication regimen. Will continue to monitor.

## 2022-07-10 NOTE — Assessment & Plan Note (Signed)
-  Patient has lost 12 pounds since last visit and a total of 47 pounds since starting medication therapy (original wt 220 pounds). Recommend to continue with Wegovy 2.4 mg once a week and take Zofran as needed for nausea. Recommend to continue to diet and lifestyle changes. Follow-up in 8 weeks.

## 2022-07-31 ENCOUNTER — Other Ambulatory Visit: Payer: Self-pay | Admitting: Physician Assistant

## 2022-07-31 DIAGNOSIS — E669 Obesity, unspecified: Secondary | ICD-10-CM

## 2022-08-25 DIAGNOSIS — H66002 Acute suppurative otitis media without spontaneous rupture of ear drum, left ear: Secondary | ICD-10-CM | POA: Diagnosis not present

## 2022-08-25 DIAGNOSIS — Z6824 Body mass index (BMI) 24.0-24.9, adult: Secondary | ICD-10-CM | POA: Diagnosis not present

## 2022-08-26 ENCOUNTER — Other Ambulatory Visit: Payer: Self-pay | Admitting: Physician Assistant

## 2022-08-26 DIAGNOSIS — F411 Generalized anxiety disorder: Secondary | ICD-10-CM

## 2022-08-26 DIAGNOSIS — F410 Panic disorder [episodic paroxysmal anxiety] without agoraphobia: Secondary | ICD-10-CM

## 2022-09-02 ENCOUNTER — Ambulatory Visit
Admission: EM | Admit: 2022-09-02 | Discharge: 2022-09-02 | Disposition: A | Discharge status: Home / Self Care | Payer: BC Managed Care – PPO | Attending: Emergency Medicine | Admitting: Emergency Medicine

## 2022-09-02 DIAGNOSIS — R1012 Left upper quadrant pain: Secondary | ICD-10-CM

## 2022-09-02 DIAGNOSIS — B029 Zoster without complications: Secondary | ICD-10-CM | POA: Diagnosis not present

## 2022-09-02 LAB — POCT URINALYSIS DIP (MANUAL ENTRY)
Bilirubin, UA: NEGATIVE
Blood, UA: NEGATIVE
Glucose, UA: NEGATIVE mg/dL
Ketones, POC UA: NEGATIVE mg/dL
Leukocytes, UA: NEGATIVE
Nitrite, UA: NEGATIVE
Protein Ur, POC: NEGATIVE mg/dL
Spec Grav, UA: 1.01 (ref 1.010–1.025)
Urobilinogen, UA: 0.2 E.U./dL
pH, UA: 7 (ref 5.0–8.0)

## 2022-09-02 LAB — POCT URINE PREGNANCY: Preg Test, Ur: NEGATIVE

## 2022-09-02 MED ORDER — VALACYCLOVIR HCL 1 G PO TABS: 1000.0000 mg | ORAL_TABLET | Freq: Three times a day (TID) | ORAL | 0 refills | Status: DC

## 2022-09-02 NOTE — ED Provider Notes (Signed)
[facility]    CSN: [id number] Arrival date & time: 09/02/22  1409      History   Chief Complaint Chief Complaint  Patient presents with   Abdominal Pain   Back Pain   Rash    HPI Terry [name] is a 41 y.o. female.  Patient presents with left upper abdominal pain, left mid back pain, and painful rash x3 days.  She also reports diarrhea.  Treatment at home with ibuprofen.  No fever, nausea, vomiting, dysuria, chest pain, shortness of breath, or other symptoms.  Her medical history includes IBS, celiac disease.       The history is provided by the patient and medical records.    Past Medical History:  Diagnosis Date   Anxiety    Celiac disease    IBS (irritable bowel syndrome)    Lipids abnormal    Migraine    Mononucleosis    Paroxysmal SVT (supraventricular tachycardia)    Tachycardia    TMJ disease    Urinary incontinence    after sneezing     Patient Active Problem List   Diagnosis Date Noted   Class 1 obesity with serious comorbidity and body mass index (BMI) of 33.0 to 33.9 in adult 02/28/2022   Perioral dermatitis 01/28/2022   Thyroid function test abnormal 07/17/2019   Acute reaction to situational stress 07/17/2019   Excessive drinking alcohol 06/29/2019   Hypertriglyceridemia 06/29/2019   Hyperlipidemia 06/29/2019   Vitamin D deficiency 06/29/2019   Medication course changed 03/15/2019   Panic disorder 03/15/2019   History of hyperlipidemia, mixed 03/15/2019   History of endogenous hypertriglyceridemia 03/15/2019   GAD (generalized anxiety disorder) 07/14/2018   Celiac disease 07/14/2018   Gluten intolerance 07/14/2018   h/o Paroxysmal SVT (supraventricular tachycardia) ([place]) 07/14/2018   Rapid heart rate 03/30/2017   Dysautonomia-orthostatic intolerance 04/08/2011   Sinus tachycardia 04/08/2011    Past Surgical History:  Procedure Laterality Date   ABDOMINOPLASTY     CESAREAN SECTION     LAPAROSCOPY     TOOTH EXTRACTION       OB History     Gravida  3   Para  2   Term  2   Preterm      AB  1   Living  2      SAB  1   IAB      Ectopic      Multiple      Live Births  2            Home Medications    Prior to Admission medications   Medication Sig Start Date End Date Taking? Authorizing Provider  valACYclovir (VALTREX) 1000 MG tablet Take 1 tablet (1,000 mg total) by mouth 3 (three) times daily. 09/02/22  Yes Sharion Balloon, NP  atenolol (TENORMIN) 25 MG tablet TAKE AS INSTRUCTED BY YOUR PRESCRIBER 07/10/22   Deboraha Sprang, MD  busPIRone (BUSPAR) 5 MG tablet TAKE ONE-HALF (1/2) TABLET IN THE MORNING AND 1 TABLET AT BEDTIME 08/26/22   Abonza, Maritza, PA-C  loratadine (CLARITIN) 10 MG tablet Take 10 mg by mouth daily as needed for allergies.    [provider]  ondansetron (ZOFRAN) 4 MG tablet TAKE 1 TABLET BY MOUTH EVERY 8 HOURS AS NEEDED FOR NAUSEA AND VOMITING 05/31/22   Lorrene Reid, PA-C  rosuvastatin (CRESTOR) 5 MG tablet Three times weekly 06/10/22   Lorrene Reid, PA-C  sertraline (ZOLOFT) 100 MG tablet TAKE 1 TABLET DAILY 04/19/22  Lorrene Reid, PA-C  Vitamin D, Ergocalciferol, (DRISDOL) 1.25 MG (50000 UNIT) CAPS capsule Take 1 capsule (50,000 Units total) by mouth once a week. 07/10/22   Abonza, Maritza, PA-C  WEGOVY 2.4 MG/0.75ML SOAJ INJECT 2.4 MG INTO THE SKIN ONCE A WEEK. 07/31/22   Ronnell Freshwater, NP    Family History Family History  Problem Relation Age of Onset   Hypertension Other    Aneurysm Maternal Grandmother    Prostate cancer Maternal Grandfather    Heart attack Paternal Grandmother    Colon cancer Neg Hx    Stomach cancer Neg Hx    Rectal cancer Neg Hx    Esophageal cancer Neg Hx    Liver cancer Neg Hx     Social History Social History   Tobacco Use   Smoking status: Never   Smokeless tobacco: Never  Vaping Use   Vaping Use: Never used  Substance Use Topics   Alcohol use: Yes    Alcohol/week: 12.0 standard drinks of alcohol     Types: 12 Standard drinks or equivalent per week   Drug use: No     Allergies   Wheat bran   Review of Systems Review of Systems  Constitutional:  Negative for chills and fever.  Respiratory:  Negative for cough and shortness of breath.   Cardiovascular:  Negative for chest pain and palpitations.  Gastrointestinal:  Positive for abdominal pain and diarrhea. Negative for nausea and vomiting.  Genitourinary:  Negative for dysuria and hematuria.  Musculoskeletal:  Positive for back pain. Negative for gait problem.  Skin:  Positive for rash.  All other systems reviewed and are negative.    Physical Exam Triage Vital Signs ED Triage Vitals  Enc Vitals Group     BP      Pulse      Resp      Temp      Temp src      SpO2      Weight      Height      Head Circumference      Peak Flow      Pain Score      Pain Loc      Pain Edu?      Excl. in [place]?    No data found.  Updated Vital Signs BP 113/79   Pulse (!) 105   Temp 98.2 F (36.8 C)   Resp 18   LMP 08/24/2022   SpO2 96%   Visual Acuity Right Eye Distance:   Left Eye Distance:   Bilateral Distance:    Right Eye Near:   Left Eye Near:    Bilateral Near:     Physical Exam Vitals and nursing note reviewed.  Constitutional:      General: She is not in acute distress.    Appearance: Normal appearance. She is well-developed. She is not ill-appearing.  HENT:     Mouth/Throat:     Mouth: Mucous membranes are moist.  Cardiovascular:     Rate and Rhythm: Normal rate and regular rhythm.     Heart sounds: Normal heart sounds.  Pulmonary:     Effort: Pulmonary effort is normal. No respiratory distress.     Breath sounds: Normal breath sounds.  Abdominal:     General: Bowel sounds are normal.     Palpations: Abdomen is soft.     Tenderness: There is no abdominal tenderness. There is no right CVA tenderness, left CVA tenderness, guarding or rebound.  Musculoskeletal:  Cervical back: Neck supple.  Skin:     General: Skin is warm and dry.     Findings: Rash present.     Comments: Papular and vesicular rash on left flank and abdomen along dermatome line.   Neurological:     Mental Status: She is alert.  Psychiatric:        Mood and Affect: Mood normal.        Behavior: Behavior normal.      UC Treatments / Results  Labs (all labs ordered are listed, but only abnormal results are displayed) Labs Reviewed  POCT URINALYSIS DIP (MANUAL ENTRY)  POCT URINE PREGNANCY    EKG   Radiology No results found.  Procedures Procedures (including critical care time)  Medications Ordered in UC Medications - No data to display  Initial Impression / Assessment and Plan / UC Course  I have reviewed the triage vital signs and the nursing notes.  Pertinent labs & imaging results that were available during my care of the patient were reviewed by me and considered in my medical decision making (see chart for details).    Herpes zoster, LUQ abdominal pain.  The rash appears to be shingles.  Treating with Valtrex.  Education provided on shingles.  Instructed patient to follow-up with her PCP.  She has an appointment scheduled tomorrow.  ED precautions discussed for abdominal and back pain.  Patient agrees to plan of care.  Final Clinical Impressions(s) / UC Diagnoses   Final diagnoses:  Herpes zoster without complication  Left upper quadrant abdominal pain     Discharge Instructions      Take the Valtrex as directed.  Follow up with your primary care provider.    Go to the emergency department if you have persistent or worsening symptoms.      ED Prescriptions     Medication Sig Dispense Auth. Provider   valACYclovir (VALTREX) 1000 MG tablet Take 1 tablet (1,000 mg total) by mouth 3 (three) times daily. 21 tablet Sharion Balloon, NP      PDMP not reviewed this encounter.   Sharion Balloon, NP 09/02/22 1512

## 2022-09-02 NOTE — Discharge Instructions (Addendum)
Take the Valtrex as directed.  Follow up with your primary care provider.    Go to the emergency department if you have persistent or worsening symptoms.

## 2022-09-02 NOTE — ED Triage Notes (Signed)
Patient presents to UC for LUQ abdominal pain, back pain, left shoulder,  and rash x 3 days. Treating pain with ibuprofen.   Denies fever.

## 2022-09-03 ENCOUNTER — Encounter: Payer: Self-pay | Admitting: Physician Assistant

## 2022-09-03 ENCOUNTER — Ambulatory Visit (INDEPENDENT_AMBULATORY_CARE_PROVIDER_SITE_OTHER): Payer: BC Managed Care – PPO | Admitting: Physician Assistant

## 2022-09-03 VITALS — BP 127/75 | HR 120 | Temp 97.8°F | Ht 69.0 in | Wt 167.0 lb

## 2022-09-03 DIAGNOSIS — Z6833 Body mass index (BMI) 33.0-33.9, adult: Secondary | ICD-10-CM

## 2022-09-03 DIAGNOSIS — B029 Zoster without complications: Secondary | ICD-10-CM

## 2022-09-03 DIAGNOSIS — E669 Obesity, unspecified: Secondary | ICD-10-CM

## 2022-09-03 MED ORDER — PREGABALIN 75 MG PO CAPS: 75.0000 mg | ORAL_CAPSULE | Freq: Two times a day (BID) | ORAL | 0 refills | Status: DC

## 2022-09-03 NOTE — Assessment & Plan Note (Addendum)
-  Advised patient can hold Wegovy 2.4 mg until feeling better and rash improves. Follow-up in 8 weeks for weight management.

## 2022-09-03 NOTE — Patient Instructions (Signed)
Postherpetic Neuralgia Postherpetic neuralgia (PHN) is nerve pain that occurs after a shingles infection. Shingles is a painful rash that appears on one area of the body, usually on the trunk or face. Shingles is caused by the varicella-zoster virus. This is the same virus that causes chickenpox. In people who have had chickenpox, the virus can resurface years later and cause shingles. PHN appears in the same area where you had the shingles rash. The pain usually goes away after the rash disappears. You may have PHN if you continue to have pain 3 months after your shingles rash has gone away. What are the causes? This condition is caused by damage to your nerves due to inflammation from the varicella-zoster virus. The damage makes your nerves overly sensitive. What increases the risk? The following factors may make you more likely to develop this condition: Being older than 43 years of age. Having severe pain before your shingles rash starts. Having a severe rash. Having shingles in and around the eye area. Having a disease or taking medicine that causes you to have a weakened disease-fighting system (immune system). What are the signs or symptoms? The main symptom of this condition is pain. The pain may: Often be severe and may be described as stabbing, burning, shooting, or feeling like an electric shock. Come and go, or it may be there all the time. Be triggered by light touches on the skin or changes in temperature. You may have itching along with the pain. How is this diagnosed? This condition may be diagnosed based on your symptoms and your history of shingles. Lab studies and other diagnostic tests are usually not needed. How is this treated? There is no cure for this condition. Treatment for PHN will focus on pain relief. Over-the-counter pain relievers do not usually relieve PHN pain. You may need to work with a pain specialist. Treatment may include: Anti-seizure medicines to relieve  nerve pain. Antidepressant medicines to help with pain and improve sleep. A numbing patch worn on the skin (lidocaine patch). Strong pain relievers (opioids). Injections of numbing medicine or anti-inflammatory medicines around irritated nerves. Injections of botulinum toxin to block pain signals between nerves and muscles. Follow these instructions at home: Medicines Take over-the-counter and prescription medicines only as told by your health care provider. Ask your health care provider if the medicine prescribed to you: Requires you to avoid driving or using machinery. Can cause constipation. You may need to take these actions to prevent or treat constipation: Drink enough fluid to keep your urine pale yellow. Take over-the-counter or prescription medicines. Eat foods that are high in fiber, such as beans, whole grains, and fresh fruits and vegetables. Limit foods that are high in fat and processed sugars, such as fried or sweet foods. Managing pain  If directed, put ice on the painful area. To do this: Put ice in a plastic bag. Place a towel between your skin and the bag. Leave the ice on for 20 minutes, 2-3 times a day. Remove the ice if your skin turns bright red. This is very important. If you cannot feel pain, heat, or cold, you have a greater risk of damage to the area. Cover sensitive areas with a bandage (dressing) to reduce friction from clothing rubbing on the area. General instructions It may take a long time to recover from PHN. Work closely with your health care provider and develop a good support system at home. You may consider joining a support group. Wear loose, comfortable clothing. Talk  to your health care provider if you feel depressed or desperate. Living with long-term pain can be depressing. Keep all follow-up visits. This is important. How is this prevented? Getting a vaccination for shingles can prevent PHN. The shingles vaccine is recommended for people older  than age 105. It may prevent shingles and may also lower your risk of PHN if you do get shingles. Contact a health care provider if: Your medicine is not helping. You are struggling to manage your pain at home. Get help right away if: You have thoughts about hurting yourself or others. If you ever feel like you may hurt yourself or others, or have thoughts about taking your own life, get help right away. Go to your nearest emergency department or: Call your local emergency services (911 in the [place] Call a suicide crisis helpline, such as the [facility] at (445) 038-3679 or 988 in the [place]. This is open 24 hours a day in the [place] Text the Crisis Text Line at 218-145-1112 (in the [place].). Summary Postherpetic neuralgia (PHN) is a very painful disorder that can occur after an episode of shingles. The pain is often severe and may be described as stabbing, burning, shooting, or feeling like an electric shock. Prescription medicines can be helpful in managing persistent pain. Getting a vaccination for shingles can prevent PHN. This vaccine is recommended for people older than age 27. This information is not intended to replace advice given to you by your health care provider. Make sure you discuss any questions you have with your health care provider. Document Revised: 05/30/2021 Document Reviewed: 10/30/2020 Elsevier Patient Education  [place].

## 2022-09-03 NOTE — Progress Notes (Signed)
Established patient visit   Patient: Terry [name]   DOB: [birth year]   41 y.o. Female  MRN: [id number] Visit Date: 09/03/2022  Chief Complaint  Patient presents with   Acute Visit    shingles   Subjective    HPI HPI     Acute Visit    Additional comments: shingles      Last edited by Adelfa Koh, CMA on 09/03/2022  2:41 PM.      Patient presents for follow-up on shingles. Patient went to urgent care yesterday and was started on Valtrex. Reports rash is painful. Has been alternating Tylenol 1000 mg and Ibuprofen 600 mg. No new symptoms.   Medications: Outpatient Medications Prior to Visit  Medication Sig   atenolol (TENORMIN) 25 MG tablet TAKE AS INSTRUCTED BY YOUR PRESCRIBER   busPIRone (BUSPAR) 5 MG tablet TAKE ONE-HALF (1/2) TABLET IN THE MORNING AND 1 TABLET AT BEDTIME   loratadine (CLARITIN) 10 MG tablet Take 10 mg by mouth daily as needed for allergies.   ondansetron (ZOFRAN) 4 MG tablet TAKE 1 TABLET BY MOUTH EVERY 8 HOURS AS NEEDED FOR NAUSEA AND VOMITING   rosuvastatin (CRESTOR) 5 MG tablet Three times weekly   sertraline (ZOLOFT) 100 MG tablet TAKE 1 TABLET DAILY   valACYclovir (VALTREX) 1000 MG tablet Take 1 tablet (1,000 mg total) by mouth 3 (three) times daily.   Vitamin D, Ergocalciferol, (DRISDOL) 1.25 MG (50000 UNIT) CAPS capsule Take 1 capsule (50,000 Units total) by mouth once a week.   WEGOVY 2.4 MG/0.75ML SOAJ INJECT 2.4 MG INTO THE SKIN ONCE A WEEK.   No facility-administered medications prior to visit.    Review of Systems Review of Systems:  A fourteen system review of systems was performed and found to be positive as per HPI.     Objective    BP 127/75   Pulse (!) 120   Temp 97.8 F (36.6 C) (Temporal)   Ht 5' 9"  (1.753 m)   Wt 167 lb (75.8 kg)   LMP 08/24/2022   SpO2 98%   BMI 24.66 kg/m  BP Readings from Last 3 Encounters:  09/03/22 127/75  09/02/22 113/79  07/10/22 95/68   Wt Readings from Last 3 Encounters:  09/03/22  167 lb (75.8 kg)  07/10/22 173 lb (78.5 kg)  05/14/22 185 lb (83.9 kg)    Physical Exam  General:  Cooperative, ill-appearing, appropriate for stated age.  Neuro:  Alert and oriented,  extra-ocular muscles intact  HEENT:  Normocephalic, atraumatic, neck supple  Skin:  papular rash with vesicular lesions noted along single dermatome of abdomen (left side) Cardiac:  RRR, S1 S2 Respiratory: CTA B/L  Vascular:  Ext warm, no cyanosis apprec.; cap RF less 2 sec. Psych:  No HI/SI, judgement and insight good, Euthymic mood. Full Affect.   No results found for any visits on 09/03/22.  Assessment & Plan      Problem List Items Addressed This Visit       Other   Class 1 obesity with serious comorbidity and body mass index (BMI) of 33.0 to 33.9 in adult    -Advised patient can hold Wegovy 2.4 mg until feeling better and rash improves. Follow-up in 8 weeks for weight management.       Other Visit Diagnoses     Herpes zoster without complication    -  Primary   Relevant Medications   pregabalin (LYRICA) 75 MG capsule      Herpes zoster without complications: -Reassurance provided  rash is consistent with Shingles. Recommend to continue with Valtrex and complete as prescribed. Recommend to take Lyrica 75 mg BID as needed for pain relief. Advised can use topical capsaicin.      Return in about 8 weeks (around 10/29/2022) for Wt.        Lorrene Reid, PA-C  [facility] at [facility] [phone] (phone) [phone] (fax)  [place]

## 2022-09-04 ENCOUNTER — Ambulatory Visit: Payer: BC Managed Care – PPO | Admitting: Physician Assistant

## 2022-09-05 ENCOUNTER — Encounter: Payer: Self-pay | Admitting: Physician Assistant

## 2022-09-10 ENCOUNTER — Other Ambulatory Visit: Payer: Self-pay | Admitting: Physician Assistant

## 2022-09-10 DIAGNOSIS — B029 Zoster without complications: Secondary | ICD-10-CM

## 2022-09-10 MED ORDER — GABAPENTIN 300 MG PO CAPS: 300.0000 mg | ORAL_CAPSULE | Freq: Two times a day (BID) | ORAL | 0 refills | Status: DC

## 2022-09-13 ENCOUNTER — Other Ambulatory Visit: Payer: Self-pay | Admitting: Physician Assistant

## 2022-09-13 ENCOUNTER — Telehealth: Payer: Self-pay

## 2022-09-13 DIAGNOSIS — B029 Zoster without complications: Secondary | ICD-10-CM

## 2022-09-13 MED ORDER — TRAMADOL HCL 50 MG PO TABS: 50.0000 mg | ORAL_TABLET | Freq: Three times a day (TID) | ORAL | 0 refills | Status: AC | PRN

## 2022-09-13 NOTE — Telephone Encounter (Signed)
Patient called office to see if she could get tramadol sent to her pharmacy [facility], please advise, thanks!

## 2022-09-17 ENCOUNTER — Other Ambulatory Visit: Payer: Self-pay | Admitting: Physician Assistant

## 2022-09-17 DIAGNOSIS — B029 Zoster without complications: Secondary | ICD-10-CM

## 2022-09-17 MED ORDER — TRIAMCINOLONE ACETONIDE 0.1 % EX OINT: 1.0000 | TOPICAL_OINTMENT | Freq: Two times a day (BID) | CUTANEOUS | 0 refills | Status: DC

## 2022-09-17 MED ORDER — GABAPENTIN 600 MG PO TABS: 600.0000 mg | ORAL_TABLET | Freq: Three times a day (TID) | ORAL | 0 refills | Status: DC

## 2022-09-30 ENCOUNTER — Encounter: Payer: Self-pay | Admitting: Physician Assistant

## 2022-09-30 DIAGNOSIS — B029 Zoster without complications: Secondary | ICD-10-CM

## 2022-10-02 ENCOUNTER — Other Ambulatory Visit: Payer: Self-pay | Admitting: Physician Assistant

## 2022-10-02 DIAGNOSIS — E559 Vitamin D deficiency, unspecified: Secondary | ICD-10-CM

## 2022-10-02 NOTE — Telephone Encounter (Signed)
Per Lorrene Reid, PA-C. Patient should be taking D3 OTC 2000 units daily. Repeat labs can be done on next OV.

## 2022-10-03 ENCOUNTER — Other Ambulatory Visit: Payer: Self-pay | Admitting: Nurse Practitioner

## 2022-10-03 DIAGNOSIS — E669 Obesity, unspecified: Secondary | ICD-10-CM

## 2022-10-14 MED ORDER — GABAPENTIN 600 MG PO TABS: 600.0000 mg | ORAL_TABLET | Freq: Three times a day (TID) | ORAL | 0 refills | Status: DC

## 2022-12-04 ENCOUNTER — Other Ambulatory Visit: Payer: Self-pay | Admitting: Nurse Practitioner

## 2022-12-04 DIAGNOSIS — E669 Obesity, unspecified: Secondary | ICD-10-CM

## 2022-12-28 ENCOUNTER — Encounter: Payer: Self-pay | Admitting: Internal Medicine

## 2023-01-29 ENCOUNTER — Encounter: Payer: Self-pay | Admitting: Family Medicine

## 2023-01-29 ENCOUNTER — Ambulatory Visit (INDEPENDENT_AMBULATORY_CARE_PROVIDER_SITE_OTHER): Payer: BC Managed Care – PPO | Admitting: Family Medicine

## 2023-01-29 VITALS — BP 116/79 | HR 82 | Resp 18 | Ht 69.0 in | Wt 156.0 lb

## 2023-01-29 DIAGNOSIS — G629 Polyneuropathy, unspecified: Secondary | ICD-10-CM

## 2023-01-29 DIAGNOSIS — Z8619 Personal history of other infectious and parasitic diseases: Secondary | ICD-10-CM | POA: Diagnosis not present

## 2023-01-29 DIAGNOSIS — H9202 Otalgia, left ear: Secondary | ICD-10-CM

## 2023-01-29 MED ORDER — VALACYCLOVIR HCL 1 G PO TABS: 1000.0000 mg | ORAL_TABLET | Freq: Three times a day (TID) | ORAL | 0 refills | Status: DC

## 2023-01-29 MED ORDER — METHYLPREDNISOLONE 16 MG PO TABS: 16.0000 mg | ORAL_TABLET | Freq: Every day | ORAL | 0 refills | Status: AC

## 2023-01-29 NOTE — Addendum Note (Signed)
Addended by: Gemma Payor on: 01/29/2023 10:22 AM   Modules accepted: Orders

## 2023-01-29 NOTE — Progress Notes (Addendum)
Acute Office Visit  Subjective:     Patient ID: Terry [name], female    DOB: 01/07/1981, 42 y.o.   MRN: [id number]  Chief Complaint  Patient presents with   Ear Pain    Left side    HPI Patient is in today for ear pain. Ear Pain: Patient presents with left ear pain.  Symptoms include  burning pain around the ear and sensitive skin that is tender to the touch similar to a sunburn . Symptoms began 5 days ago and are gradually worsening since that time. Patient denies chills, dyspnea, bilateral ear congestion, bilateral ear pain, eye irritation, facial pain  , fever, headache  , myalgias, nasal congestion, sneezing, sore throat, sweats, and wheezing.  She does endorse some tooth pain and vertigo over the weekend.  Denies nystagmus, tinnitus, facial paralysis.  She has a history of herpes zoster this past October, was treated with valacyclovir.  She states that this pain feels very similar to the beginning of that infection, but denies any visible rash.  She has not gotten the shingles vaccines as it is not indicated for her age.  ROS See HPI     Objective:    BP 116/79 (BP Location: Right Arm, Patient Position: Sitting, Cuff Size: Normal)   Pulse 82   Resp 18   Ht '5\' 9"'$  (1.753 m)   Wt 156 lb (70.8 kg)   SpO2 98%   BMI 23.04 kg/m   Physical Exam Constitutional:      General: She is not in acute distress.    Appearance: Normal appearance. She is not ill-appearing.  HENT:     Head: Normocephalic and atraumatic.     Right Ear: Hearing, tympanic membrane, ear canal and external ear normal.     Left Ear: Hearing, tympanic membrane, ear canal and external ear normal. Tenderness (Tender to palpation on skin of auricle, neck, and behind ear including mastoid process) present. Tympanic membrane is not erythematous, retracted or bulging.     Ears:     Comments: No presence of vesicles or rash.    Nose: Nose normal. No congestion or rhinorrhea.     Mouth/Throat:     Mouth: Mucous  membranes are moist.     Pharynx: Oropharynx is clear. No oropharyngeal exudate or posterior oropharyngeal erythema.  Eyes:     General:        Right eye: No discharge.        Left eye: No discharge.     Conjunctiva/sclera: Conjunctivae normal.     Pupils: Pupils are equal, round, and reactive to light.  Pulmonary:     Effort: Pulmonary effort is normal.     Breath sounds: Normal breath sounds.  Skin:    General: Skin is warm and dry.     Findings: No erythema or rash.  Neurological:     Mental Status: She is alert and oriented to person, place, and time.     Cranial Nerves: No cranial nerve deficit.      Assessment & Plan:  Hx of herpes zoster -     Varicella-zoster by PCR; Future -     valACYclovir HCl; Take 1 tablet (1,000 mg total) by mouth 3 (three) times daily.  Dispense: 21 tablet; Refill: 0 -     methylPREDNISolone; Take 1 tablet (16 mg total) by mouth daily for 5 days.  Dispense: 5 tablet; Refill: 0 -     Varicella zoster antibody, IgG; Future  Neuropathy -  Varicella-zoster by PCR; Future -     valACYclovir HCl; Take 1 tablet (1,000 mg total) by mouth 3 (three) times daily.  Dispense: 21 tablet; Refill: 0 -     methylPREDNISolone; Take 1 tablet (16 mg total) by mouth daily for 5 days.  Dispense: 5 tablet; Refill: 0  Otalgia of left ear -     Varicella-zoster by PCR; Future -     valACYclovir HCl; Take 1 tablet (1,000 mg total) by mouth 3 (three) times daily.  Dispense: 21 tablet; Refill: 0 -     methylPREDNISolone; Take 1 tablet (16 mg total) by mouth daily for 5 days.  Dispense: 5 tablet; Refill: 0  Given the unilateral presentation of burning pain in conjunction with tooth pain and vertigo and history of herpes zoster, we discussed starting valacyclovir while awaiting results of PCR results as this presentation may be indicative of Ramsay Hunt syndrome.  Patient has a history of poor psychiatric reaction to prednisone and is hesitant to start it.  We discussed an  alternative of taking a 5-day course of the lowest available dose of methylprednisolone if PCR confirms herpes zoster infection.  Patient verbalized understanding and is agreeable to this plan.  Return in 1 month (on 03/03/2023) for Annual wellness visit (already scheduled).  Velva Harman, PA

## 2023-01-29 NOTE — Addendum Note (Signed)
Addended by: Gemma Payor on: 01/29/2023 10:25 AM   Modules accepted: Orders

## 2023-01-29 NOTE — Patient Instructions (Signed)
Start the antiviral (valacyclovir) as soon as you pick it up.  Once we get the lab results, I will send you a message to let you know if it is confirmed to be herpes zoster.  If it is, then I would recommend starting the 5-day course of the methylprednisolone.  I have you at the lowest dose available, so I am hopeful it will not cause as many issues as the prednisone did last time!

## 2023-01-30 ENCOUNTER — Other Ambulatory Visit: Payer: Self-pay | Admitting: Nurse Practitioner

## 2023-01-30 DIAGNOSIS — B029 Zoster without complications: Secondary | ICD-10-CM

## 2023-01-30 LAB — VARICELLA ZOSTER ANTIBODY, IGG: Varicella zoster IgG: 2989 index (ref >165)

## 2023-01-30 MED ORDER — GABAPENTIN 600 MG PO TABS: 600.0000 mg | ORAL_TABLET | Freq: Three times a day (TID) | ORAL | 0 refills | Status: DC

## 2023-02-26 ENCOUNTER — Other Ambulatory Visit: Payer: BC Managed Care – PPO

## 2023-03-03 ENCOUNTER — Encounter: Payer: BC Managed Care – PPO | Admitting: Family Medicine

## 2023-03-04 ENCOUNTER — Other Ambulatory Visit: Payer: Self-pay

## 2023-03-04 DIAGNOSIS — E785 Hyperlipidemia, unspecified: Secondary | ICD-10-CM

## 2023-03-04 DIAGNOSIS — R946 Abnormal results of thyroid function studies: Secondary | ICD-10-CM

## 2023-03-04 DIAGNOSIS — Z0000 Encounter for general adult medical examination without abnormal findings: Secondary | ICD-10-CM

## 2023-03-04 DIAGNOSIS — Z130 Encounter for screening for diseases of the blood and blood-forming organs and certain disorders involving the immune mechanism: Secondary | ICD-10-CM

## 2023-03-05 ENCOUNTER — Other Ambulatory Visit: Payer: BC Managed Care – PPO

## 2023-03-05 DIAGNOSIS — Z0000 Encounter for general adult medical examination without abnormal findings: Secondary | ICD-10-CM

## 2023-03-05 DIAGNOSIS — Z1321 Encounter for screening for nutritional disorder: Secondary | ICD-10-CM | POA: Diagnosis not present

## 2023-03-05 DIAGNOSIS — Z1329 Encounter for screening for other suspected endocrine disorder: Secondary | ICD-10-CM | POA: Diagnosis not present

## 2023-03-05 DIAGNOSIS — Z13228 Encounter for screening for other metabolic disorders: Secondary | ICD-10-CM | POA: Diagnosis not present

## 2023-03-05 DIAGNOSIS — E785 Hyperlipidemia, unspecified: Secondary | ICD-10-CM

## 2023-03-05 DIAGNOSIS — Z130 Encounter for screening for diseases of the blood and blood-forming organs and certain disorders involving the immune mechanism: Secondary | ICD-10-CM

## 2023-03-05 DIAGNOSIS — R946 Abnormal results of thyroid function studies: Secondary | ICD-10-CM

## 2023-03-06 LAB — CBC WITH DIFFERENTIAL/PLATELET
Basos: 1 %
Eos: 1 %
Immature Granulocytes: 0 %
MCH: 31 pg (ref 26.6–33.0)
Monocytes: 8 %
Neutrophils Absolute: 2.5 10*3/uL (ref 1.4–7.0)
Neutrophils: 55 %
Platelets: 290 10*3/uL (ref 150–450)
RBC: 4.58 x10E6/uL (ref 3.77–5.28)

## 2023-03-06 LAB — LIPID PANEL

## 2023-03-07 LAB — CBC WITH DIFFERENTIAL/PLATELET
Basophils Absolute: 0 10*3/uL (ref 0.0–0.2)
EOS (ABSOLUTE): 0 10*3/uL (ref 0.0–0.4)
Hematocrit: 43.2 % (ref 34.0–46.6)
Hemoglobin: 14.2 g/dL (ref 11.1–15.9)
Immature Grans (Abs): 0 10*3/uL (ref 0.0–0.1)
Lymphocytes Absolute: 1.6 10*3/uL (ref 0.7–3.1)
Lymphs: 35 %
MCHC: 32.9 g/dL (ref 31.5–35.7)
MCV: 94 fL (ref 79–97)
Monocytes Absolute: 0.4 10*3/uL (ref 0.1–0.9)
RDW: 12.6 % (ref 11.7–15.4)
WBC: 4.6 10*3/uL (ref 3.4–10.8)

## 2023-03-07 LAB — COMPREHENSIVE METABOLIC PANEL
ALT: 21 IU/L (ref 0–32)
AST: 22 IU/L (ref 0–40)
Albumin/Globulin Ratio: 1.7 (ref 1.2–2.2)
Albumin: 4.5 g/dL (ref 3.9–4.9)
Alkaline Phosphatase: 80 IU/L (ref 44–121)
BUN/Creatinine Ratio: 13 (ref 9–23)
BUN: 9 mg/dL (ref 6–24)
Bilirubin Total: 0.2 mg/dL (ref 0.0–1.2)
CO2: 21 mmol/L (ref 20–29)
Calcium: 9.5 mg/dL (ref 8.7–10.2)
Chloride: 101 mmol/L (ref 96–106)
Creatinine, Ser: 0.68 mg/dL (ref 0.57–1.00)
Globulin, Total: 2.6 g/dL (ref 1.5–4.5)
Glucose: 84 mg/dL (ref 70–99)
Potassium: 4.4 mmol/L (ref 3.5–5.2)
Sodium: 141 mmol/L (ref 134–144)
Total Protein: 7.1 g/dL (ref 6.0–8.5)
eGFR: 110 mL/min/{1.73_m2} (ref >59)

## 2023-03-07 LAB — HEMOGLOBIN A1C
Est. average glucose Bld gHb Est-mCnc: 97 mg/dL
Hgb A1c MFr Bld: 5 % (ref 4.8–5.6)

## 2023-03-07 LAB — TSH: TSH: 1.71 u[IU]/mL (ref 0.450–4.500)

## 2023-03-07 LAB — LIPID PANEL
Cholesterol, Total: 277 mg/dL — ABNORMAL HIGH (ref 100–199)
HDL: 61 mg/dL (ref >39)
LDL Chol Calc (NIH): 179 mg/dL — ABNORMAL HIGH (ref 0–99)
VLDL Cholesterol Cal: 37 mg/dL (ref 5–40)

## 2023-03-11 ENCOUNTER — Other Ambulatory Visit: Payer: Self-pay

## 2023-03-11 DIAGNOSIS — Z1231 Encounter for screening mammogram for malignant neoplasm of breast: Secondary | ICD-10-CM

## 2023-03-12 ENCOUNTER — Ambulatory Visit (INDEPENDENT_AMBULATORY_CARE_PROVIDER_SITE_OTHER): Payer: BC Managed Care – PPO | Admitting: Family Medicine

## 2023-03-12 ENCOUNTER — Encounter: Payer: Self-pay | Admitting: Family Medicine

## 2023-03-12 VITALS — BP 109/75 | HR 96 | Resp 18 | Ht 69.0 in | Wt 157.0 lb

## 2023-03-12 DIAGNOSIS — D229 Melanocytic nevi, unspecified: Secondary | ICD-10-CM

## 2023-03-12 DIAGNOSIS — F411 Generalized anxiety disorder: Secondary | ICD-10-CM | POA: Diagnosis not present

## 2023-03-12 DIAGNOSIS — E66811 Obesity, class 1: Secondary | ICD-10-CM

## 2023-03-12 DIAGNOSIS — E669 Obesity, unspecified: Secondary | ICD-10-CM

## 2023-03-12 DIAGNOSIS — B0229 Other postherpetic nervous system involvement: Secondary | ICD-10-CM

## 2023-03-12 DIAGNOSIS — Z6833 Body mass index (BMI) 33.0-33.9, adult: Secondary | ICD-10-CM

## 2023-03-12 DIAGNOSIS — E782 Mixed hyperlipidemia: Secondary | ICD-10-CM

## 2023-03-12 DIAGNOSIS — Z0000 Encounter for general adult medical examination without abnormal findings: Secondary | ICD-10-CM

## 2023-03-12 DIAGNOSIS — R61 Generalized hyperhidrosis: Secondary | ICD-10-CM

## 2023-03-12 MED ORDER — GABAPENTIN 600 MG PO TABS
600.0000 mg | ORAL_TABLET | Freq: Every day | ORAL | 0 refills | Status: DC
Start: 2023-03-12 — End: 2023-05-06

## 2023-03-12 MED ORDER — ROSUVASTATIN CALCIUM 5 MG PO TABS
ORAL_TABLET | ORAL | 3 refills | Status: DC
Start: 2023-03-12 — End: 2023-07-18

## 2023-03-12 NOTE — Progress Notes (Signed)
Complete physical exam  Patient: Terry [name]   DOB: 26-Oct-1981   42 y.o. Female  MRN: [id number]  Subjective:    Chief Complaint  Patient presents with   Annual Exam    Terry [name] is a 42 y.o. female who presents today for a complete physical exam. She reports consuming a  gluten-free  diet. Home exercise routine includes playing tennis with her daughter. She generally feels well. She reports sleeping well. She does have additional problems to discuss today.  She would like to establish with a dermatologist to start having annual skin checks given her fair complexion.  She also continues to have night sweats and is concerned that she may be going into menopause.  The burning sensation continues to persist on her right ribs and at times has also seem to spread up to the neck and down her right arm.  The gabapentin has been helpful, so if it is safe she would like to continue taking 1 dose nightly before bed.   Most recent fall risk assessment:    03/12/2023    1:14 PM  Fall Risk   Falls in the past year? 0  Number falls in past yr: 0  Injury with Fall? 0  Risk for fall due to : No Fall Risks  Follow up Falls evaluation completed     Most recent depression and anxiety screenings:    03/12/2023    1:13 PM 01/29/2023    8:15 AM  PHQ 2/9 Scores  PHQ - 2 Score 0 0  PHQ- 9 Score 1       03/12/2023    1:14 PM 09/03/2022    2:43 PM 07/10/2022    9:03 AM 05/14/2022   10:24 AM  GAD 7 : Generalized Anxiety Score  Nervous, Anxious, on Edge 0 0 0 0  Control/stop worrying 0 0 0 0  Worry too much - different things 1 0 0 0  Trouble relaxing 0 0 0 0  Restless 0 0 0 0  Easily annoyed or irritable 0 0 0 0  Afraid - awful might happen 0 0 0 0  Total GAD 7 Score 1 0 0 0  Anxiety Difficulty Not difficult at all Not difficult at all Not difficult at all Not difficult at all    Patient Active Problem List   Diagnosis Date Noted   Postherpetic neuralgia 03/12/2023   Class 1 obesity  with serious comorbidity and body mass index (BMI) of 33.0 to 33.9 in adult 02/28/2022   Excessive drinking alcohol 06/29/2019   Vitamin D deficiency 06/29/2019   Panic disorder 03/15/2019   Mixed hyperlipidemia 03/15/2019   GAD (generalized anxiety disorder) 07/14/2018   Celiac disease 07/14/2018   Dysautonomia-orthostatic intolerance 04/08/2011   h/o Paroxysmal SVT (supraventricular tachycardia) (HCC) 04/08/2011    Past Surgical History:  Procedure Laterality Date   ABDOMINOPLASTY     CESAREAN SECTION     LAPAROSCOPY     TOOTH EXTRACTION     Social History   Tobacco Use   Smoking status: Never    Passive exposure: Never   Smokeless tobacco: Never  Vaping Use   Vaping Use: Never used  Substance Use Topics   Alcohol use: Yes    Alcohol/week: 12.0 standard drinks of alcohol    Types: 12 Standard drinks or equivalent per week   Drug use: No   Family History  Problem Relation Age of Onset   Hypertension Other    Aneurysm Maternal Grandmother  Prostate cancer Maternal Grandfather    Heart attack Paternal Grandmother    Colon cancer Neg Hx    Stomach cancer Neg Hx    Rectal cancer Neg Hx    Esophageal cancer Neg Hx    Liver cancer Neg Hx    Allergies  Allergen Reactions   Wheat Other (See Comments)    All gluten     Patient Care Team: Melida Quitter, PA as PCP - General (Family Medicine) Duke Salvia, MD as Consulting Physician (Cardiology)   Outpatient Medications Prior to Visit  Medication Sig   atenolol (TENORMIN) 25 MG tablet TAKE AS INSTRUCTED BY YOUR PRESCRIBER   busPIRone (BUSPAR) 5 MG tablet TAKE ONE-HALF (1/2) TABLET IN THE MORNING AND 1 TABLET AT BEDTIME   loratadine (CLARITIN) 10 MG tablet Take 10 mg by mouth daily as needed for allergies.   ondansetron (ZOFRAN) 4 MG tablet TAKE 1 TABLET BY MOUTH EVERY 8 HOURS AS NEEDED FOR NAUSEA AND VOMITING   Semaglutide-Weight Management (WEGOVY) 2.4 MG/0.75ML SOAJ INJECT 2.4 MG INTO THE SKIN ONCE A WEEK.    sertraline (ZOLOFT) 100 MG tablet TAKE 1 TABLET DAILY   [DISCONTINUED] gabapentin (NEURONTIN) 600 MG tablet Take 1 tablet (600 mg total) by mouth 3 (three) times daily. (Patient taking differently: Take 600 mg by mouth daily.)   [DISCONTINUED] rosuvastatin (CRESTOR) 5 MG tablet Three times weekly (Patient taking differently: Take 5 mg by mouth once a week. Three times weekly)   [DISCONTINUED] triamcinolone ointment (KENALOG) 0.1 % Apply 1 Application topically 2 (two) times daily. (Patient not taking: Reported on 01/29/2023)   [DISCONTINUED] valACYclovir (VALTREX) 1000 MG tablet Take 1 tablet (1,000 mg total) by mouth 3 (three) times daily.   [DISCONTINUED] Vitamin D, Ergocalciferol, (DRISDOL) 1.25 MG (50000 UNIT) CAPS capsule Take 1 capsule (50,000 Units total) by mouth once a week. (Patient not taking: Reported on 01/29/2023)   No facility-administered medications prior to visit.    Review of Systems  Constitutional:  Negative for chills, fever and malaise/fatigue.       Night sweats  HENT:  Negative for congestion and hearing loss.   Eyes:  Negative for blurred vision and double vision.  Respiratory:  Negative for cough and shortness of breath.   Cardiovascular:  Negative for chest pain, palpitations and leg swelling.  Gastrointestinal:  Negative for abdominal pain, constipation, diarrhea and heartburn.  Genitourinary:  Negative for frequency and urgency.  Musculoskeletal:  Negative for myalgias and neck pain.  Skin:  Negative for rash.  Neurological:  Positive for sensory change (Burning and tingling right ribs). Negative for weakness and headaches.  Endo/Heme/Allergies:  Negative for polydipsia.  Psychiatric/Behavioral:  Negative for depression. The patient is not nervous/anxious and does not have insomnia.      Objective:    BP 109/75 (BP Location: Left Arm, Patient Position: Sitting, Cuff Size: Normal)   Pulse 96   Resp 18   Ht  (1.753 m)   Wt 157 lb (71.2 kg)   SpO2 98%    BMI 23.18 kg/m    Physical Exam Constitutional:      General: She is not in acute distress.    Appearance: Normal appearance.  HENT:     Head: Normocephalic and atraumatic.     Right Ear: Tympanic membrane and ear canal normal.     Left Ear: Tympanic membrane and ear canal normal.     Nose: Nose normal.     Mouth/Throat:     Mouth: Mucous membranes  are moist.     Pharynx: No oropharyngeal exudate or posterior oropharyngeal erythema.  Eyes:     Extraocular Movements: Extraocular movements intact.     Conjunctiva/sclera: Conjunctivae normal.     Pupils: Pupils are equal, round, and reactive to light.  Neck:     Thyroid: No thyroid mass, thyromegaly or thyroid tenderness.  Cardiovascular:     Rate and Rhythm: Normal rate and regular rhythm.     Heart sounds: Normal heart sounds. No murmur heard.    No friction rub. No gallop.  Pulmonary:     Effort: Pulmonary effort is normal.     Breath sounds: Normal breath sounds. No wheezing, rhonchi or rales.  Abdominal:     General: Abdomen is flat. Bowel sounds are normal.     Palpations: There is no mass.     Tenderness: There is no abdominal tenderness.  Musculoskeletal:        General: Normal range of motion.     Cervical back: Normal range of motion and neck supple. No rigidity or tenderness.  Lymphadenopathy:     Cervical: No cervical adenopathy.  Skin:    General: Skin is warm and dry.  Neurological:     Mental Status: She is alert and oriented to person, place, and time.     Cranial Nerves: No cranial nerve deficit.     Deep Tendon Reflexes: Reflexes normal.  Psychiatric:        Mood and Affect: Mood normal.       Assessment & Plan:    Routine Health Maintenance and Physical Exam  Immunization History  Administered Date(s) Administered   Influenza,inj,Quad PF,6+ Mos 08/25/2018, 09/03/2019, 08/31/2020, 08/21/2021   Moderna SARS-COV2 Booster Vaccination 09/19/2020   Moderna Sars-Covid-2 Vaccination 01/20/2020,  02/21/2020   Tdap 09/03/2019    Health Maintenance  Topic Date Due   Hepatitis C Screening  Never done   COVID-19 Vaccine (3 - Moderna risk series) 10/17/2020   MAMMOGRAM  04/02/2023   INFLUENZA VACCINE  06/19/2023   PAP SMEAR-Modifier  02/27/2025   DTaP/Tdap/Td (2 - Td or Tdap) 09/02/2029   HIV Screening  Completed   HPV VACCINES  Aged Out   Mammogram is scheduled for next month.  Pap smear not due until 2026. Up-to-date on vaccinations.  Discussed health benefits of physical activity, and encouraged her to engage in regular exercise appropriate for her age and condition.  Wellness examination  Class 1 obesity with serious comorbidity and body mass index (BMI) of 33.0 to 33.9 in adult, unspecified obesity type Assessment & Plan: Patient previously on Wegovy 2.4 mg weekly, had successful weight loss and maintenance and tolerated it well.  Her insurance stopped covering GLP-1 medications 3 months ago, and since that time she has been getting compounded semaglutide.  We discussed that this is not FDA regulated and to be cautious about using it.   GAD (generalized anxiety disorder) Assessment & Plan: PHQ-9 score of 1, GAD-7 score of 1.  Stable.  Continue Zoloft 100 mg daily, buspirone 5 mg daily in 2 divided doses.   Mixed hyperlipidemia Assessment & Plan: Last lipid panel: LDL 179, HDL 61, triglycerides 201.  Patient states that she has only been taking rosuvastatin 5 mg once a week instead of 3 times a week as she misread the bottle.  Resume rosuvastatin 5 mg 3 times weekly and maintain heart healthy diet low in fat and regular physical activity.  Will continue to monitor.  Orders: -     Rosuvastatin  Calcium; Three times weekly  Dispense: 12 tablet; Refill: 3  Postherpetic neuralgia Assessment & Plan: Continue gabapentin 600 mg nightly at bedtime.  Provided education and informational handout on postherpetic neuralgia.  Patient does not feel that it is necessary to go to  neurology at this time as she is able to manage with the daily dose of gabapentin.  Orders: -     Gabapentin; Take 1 tablet (600 mg total) by mouth at bedtime.  Dispense: 90 tablet; Refill: 0  Multiple nevi -     Ambulatory referral to Dermatology  Night sweats -     Follicle stimulating hormone -     Estradiol -     T4, free; Future  Ambulatory referral to dermatology for annual skin checks and to establish care.  Checking FSH and estradiol for menopausal symptom of night sweats.  TSH was within normal limits, but checking T4 in case subclinical hypothyroidism is causing night sweats.  Return in about 6 months (around 09/11/2023) for follow-up for HLD, GAD, fasting blood work 1 week before.   Melida Quitter, PA

## 2023-03-12 NOTE — Assessment & Plan Note (Signed)
Continue gabapentin 600 mg nightly at bedtime.  Provided education and informational handout on postherpetic neuralgia.  Patient does not feel that it is necessary to go to neurology at this time as she is able to manage with the daily dose of gabapentin.

## 2023-03-12 NOTE — Assessment & Plan Note (Signed)
Last lipid panel: LDL 179, HDL 61, triglycerides 201.  Patient states that she has only been taking rosuvastatin 5 mg once a week instead of 3 times a week as she misread the bottle.  Resume rosuvastatin 5 mg 3 times weekly and maintain heart healthy diet low in fat and regular physical activity.  Will continue to monitor.

## 2023-03-12 NOTE — Patient Instructions (Signed)
Take your rosuvastatin 3 times a week.

## 2023-03-12 NOTE — Assessment & Plan Note (Signed)
Patient previously on Wegovy 2.4 mg weekly, had successful weight loss and maintenance and tolerated it well.  Her insurance stopped covering GLP-1 medications 3 months ago, and since that time she has been getting compounded semaglutide.  We discussed that this is not FDA regulated and to be cautious about using it.

## 2023-03-12 NOTE — Assessment & Plan Note (Signed)
PHQ-9 score of 1, GAD-7 score of 1.  Stable.  Continue Zoloft 100 mg daily, buspirone 5 mg daily in 2 divided doses.

## 2023-03-14 ENCOUNTER — Other Ambulatory Visit: Payer: BC Managed Care – PPO

## 2023-03-14 DIAGNOSIS — R61 Generalized hyperhidrosis: Secondary | ICD-10-CM

## 2023-03-15 LAB — T4, FREE: Free T4: 1.21 ng/dL (ref 0.82–1.77)

## 2023-04-03 ENCOUNTER — Ambulatory Visit
Admission: RE | Admit: 2023-04-03 | Discharge: 2023-04-03 | Disposition: A | Discharge status: Home / Self Care | Payer: BC Managed Care – PPO | Source: Ambulatory Visit

## 2023-04-03 DIAGNOSIS — Z1231 Encounter for screening mammogram for malignant neoplasm of breast: Secondary | ICD-10-CM | POA: Diagnosis not present

## 2023-04-07 ENCOUNTER — Other Ambulatory Visit: Payer: Self-pay | Admitting: Family Medicine

## 2023-04-07 DIAGNOSIS — R928 Other abnormal and inconclusive findings on diagnostic imaging of breast: Secondary | ICD-10-CM

## 2023-04-10 DIAGNOSIS — Z1231 Encounter for screening mammogram for malignant neoplasm of breast: Secondary | ICD-10-CM

## 2023-04-15 ENCOUNTER — Other Ambulatory Visit: Payer: Self-pay | Admitting: Internal Medicine

## 2023-04-16 ENCOUNTER — Ambulatory Visit
Admission: RE | Admit: 2023-04-16 | Discharge: 2023-04-16 | Disposition: A | Discharge status: Home / Self Care | Payer: BC Managed Care – PPO | Source: Ambulatory Visit | Attending: Family Medicine | Admitting: Family Medicine

## 2023-04-16 ENCOUNTER — Ambulatory Visit: Payer: BC Managed Care – PPO

## 2023-04-16 DIAGNOSIS — R92332 Mammographic heterogeneous density, left breast: Secondary | ICD-10-CM | POA: Diagnosis not present

## 2023-04-16 DIAGNOSIS — R928 Other abnormal and inconclusive findings on diagnostic imaging of breast: Secondary | ICD-10-CM

## 2023-04-16 DIAGNOSIS — R922 Inconclusive mammogram: Secondary | ICD-10-CM | POA: Diagnosis not present

## 2023-04-21 DIAGNOSIS — S022XXA Fracture of nasal bones, initial encounter for closed fracture: Secondary | ICD-10-CM | POA: Diagnosis not present

## 2023-04-24 DIAGNOSIS — W19XXXA Unspecified fall, initial encounter: Secondary | ICD-10-CM | POA: Diagnosis not present

## 2023-04-24 DIAGNOSIS — X58XXXA Exposure to other specified factors, initial encounter: Secondary | ICD-10-CM | POA: Diagnosis not present

## 2023-04-24 DIAGNOSIS — S022XXA Fracture of nasal bones, initial encounter for closed fracture: Secondary | ICD-10-CM | POA: Diagnosis not present

## 2023-04-27 ENCOUNTER — Encounter: Payer: Self-pay | Admitting: Family Medicine

## 2023-04-27 DIAGNOSIS — B0229 Other postherpetic nervous system involvement: Secondary | ICD-10-CM

## 2023-05-01 DIAGNOSIS — S022XXA Fracture of nasal bones, initial encounter for closed fracture: Secondary | ICD-10-CM | POA: Diagnosis not present

## 2023-05-04 DIAGNOSIS — W5501XA Bitten by cat, initial encounter: Secondary | ICD-10-CM | POA: Diagnosis not present

## 2023-05-04 DIAGNOSIS — L03114 Cellulitis of left upper limb: Secondary | ICD-10-CM | POA: Diagnosis not present

## 2023-05-06 MED ORDER — GABAPENTIN 600 MG PO TABS
600.0000 mg | ORAL_TABLET | Freq: Every day | ORAL | 0 refills | Status: DC
Start: 2023-05-06 — End: 2023-08-08

## 2023-05-06 NOTE — Addendum Note (Signed)
Addended by: Saralyn Pilar on: 05/06/2023 03:14 PM   Modules accepted: Orders

## 2023-05-07 ENCOUNTER — Encounter: Payer: Self-pay | Admitting: Family Medicine

## 2023-05-07 ENCOUNTER — Ambulatory Visit: Payer: BC Managed Care – PPO | Admitting: Family Medicine

## 2023-05-15 ENCOUNTER — Encounter: Payer: Self-pay | Admitting: Family Medicine

## 2023-05-15 DIAGNOSIS — F411 Generalized anxiety disorder: Secondary | ICD-10-CM

## 2023-05-15 MED ORDER — SERTRALINE HCL 100 MG PO TABS
100.0000 mg | ORAL_TABLET | Freq: Every day | ORAL | 1 refills | Status: DC
Start: 2023-05-15 — End: 2024-01-19

## 2023-05-15 MED ORDER — SERTRALINE HCL 100 MG PO TABS
100.0000 mg | ORAL_TABLET | Freq: Every day | ORAL | 1 refills | Status: DC
Start: 2023-05-15 — End: 2023-05-15

## 2023-05-15 MED ORDER — SERTRALINE HCL 100 MG PO TABS
100.0000 mg | ORAL_TABLET | Freq: Every day | ORAL | 0 refills | Status: DC
Start: 2023-05-15 — End: 2023-05-15

## 2023-05-28 ENCOUNTER — Other Ambulatory Visit: Payer: Self-pay | Admitting: Family Medicine

## 2023-05-28 DIAGNOSIS — B0229 Other postherpetic nervous system involvement: Secondary | ICD-10-CM

## 2023-07-15 ENCOUNTER — Other Ambulatory Visit: Payer: Self-pay | Admitting: Internal Medicine

## 2023-07-17 ENCOUNTER — Encounter: Payer: Self-pay | Admitting: Family Medicine

## 2023-07-18 ENCOUNTER — Other Ambulatory Visit: Payer: Self-pay

## 2023-07-18 ENCOUNTER — Other Ambulatory Visit: Payer: Self-pay | Admitting: Family Medicine

## 2023-07-18 DIAGNOSIS — E782 Mixed hyperlipidemia: Secondary | ICD-10-CM

## 2023-07-18 MED ORDER — ROSUVASTATIN CALCIUM 5 MG PO TABS
5.0000 mg | ORAL_TABLET | ORAL | 3 refills | Status: DC
Start: 2023-07-18 — End: 2023-10-01

## 2023-07-24 ENCOUNTER — Telehealth: Payer: Self-pay | Admitting: Internal Medicine

## 2023-07-24 NOTE — Telephone Encounter (Signed)
Pt called in stating she got a notice that she needs to make an appt for refill. However she was told she didn't need to f/u here anymore unless needed. Please advise if she can still get refills or does she does need to request from PCP.

## 2023-07-24 NOTE — Telephone Encounter (Signed)
Called pt and left message informing pt to contact PCP for refills, if Dr. Graciela Husbands stated that she can come back to see him on a as needed basis and if she has any other problems, questions or concerns to give our office a call back.

## 2023-08-08 ENCOUNTER — Other Ambulatory Visit: Payer: Self-pay | Admitting: Family Medicine

## 2023-08-08 ENCOUNTER — Encounter: Payer: Self-pay | Admitting: Family Medicine

## 2023-08-08 DIAGNOSIS — B0229 Other postherpetic nervous system involvement: Secondary | ICD-10-CM

## 2023-08-28 ENCOUNTER — Telehealth: Payer: Self-pay

## 2023-08-28 ENCOUNTER — Other Ambulatory Visit: Payer: Self-pay

## 2023-08-28 DIAGNOSIS — R61 Generalized hyperhidrosis: Secondary | ICD-10-CM

## 2023-08-28 DIAGNOSIS — E782 Mixed hyperlipidemia: Secondary | ICD-10-CM

## 2023-08-28 DIAGNOSIS — F410 Panic disorder [episodic paroxysmal anxiety] without agoraphobia: Secondary | ICD-10-CM

## 2023-08-28 DIAGNOSIS — F411 Generalized anxiety disorder: Secondary | ICD-10-CM

## 2023-08-28 MED ORDER — BUSPIRONE HCL 5 MG PO TABS: ORAL_TABLET | ORAL | 1 refills | Status: DC

## 2023-08-28 NOTE — Telephone Encounter (Signed)
Refill sent to [facility].  

## 2023-08-28 NOTE — Telephone Encounter (Signed)
Prescription Request  08/28/2023  LOV: 03/12/23   What is the name of the medication or equipment? busPIRone (BUSPAR) 5 MG tablet   Have you contacted your pharmacy to request a refill? No   Which pharmacy would you like this sent to?   [facility] - [place], [place] - [address] [place] [address] Phone: [phone] Fax: [phone]  Patient notified that their request is being sent to the clinical staff for review and that they should receive a response within 2 business days.   Please advise at Mobile [phone] (mobile)  \

## 2023-09-04 ENCOUNTER — Other Ambulatory Visit: Payer: BC Managed Care – PPO

## 2023-09-08 ENCOUNTER — Other Ambulatory Visit: Payer: BC Managed Care – PPO

## 2023-09-11 ENCOUNTER — Ambulatory Visit: Payer: BC Managed Care – PPO | Admitting: Family Medicine

## 2023-09-24 ENCOUNTER — Other Ambulatory Visit: Payer: BC Managed Care – PPO

## 2023-09-24 DIAGNOSIS — R61 Generalized hyperhidrosis: Secondary | ICD-10-CM

## 2023-09-24 DIAGNOSIS — E782 Mixed hyperlipidemia: Secondary | ICD-10-CM | POA: Diagnosis not present

## 2023-09-25 LAB — LIPID PANEL
Chol/HDL Ratio: 4.2 ratio (ref 0.0–4.4)
Cholesterol, Total: 220 mg/dL — ABNORMAL HIGH (ref 100–199)
HDL: 53 mg/dL (ref >39)
LDL Chol Calc (NIH): 126 mg/dL — ABNORMAL HIGH (ref 0–99)
Triglycerides: 236 mg/dL — ABNORMAL HIGH (ref 0–149)
VLDL Cholesterol Cal: 41 mg/dL — ABNORMAL HIGH (ref 5–40)

## 2023-09-25 LAB — FOLLICLE STIMULATING HORMONE: FSH: 7.9 m[IU]/mL

## 2023-09-25 LAB — ESTRADIOL: Estradiol: 106 pg/mL

## 2023-10-01 ENCOUNTER — Ambulatory Visit (INDEPENDENT_AMBULATORY_CARE_PROVIDER_SITE_OTHER): Payer: BC Managed Care – PPO | Admitting: Family Medicine

## 2023-10-01 ENCOUNTER — Encounter: Payer: Self-pay | Admitting: Family Medicine

## 2023-10-01 VITALS — BP 115/67 | HR 97 | Resp 18 | Ht 69.0 in | Wt 175.0 lb

## 2023-10-01 DIAGNOSIS — E66811 Obesity, class 1: Secondary | ICD-10-CM | POA: Diagnosis not present

## 2023-10-01 DIAGNOSIS — E782 Mixed hyperlipidemia: Secondary | ICD-10-CM

## 2023-10-01 DIAGNOSIS — Z1159 Encounter for screening for other viral diseases: Secondary | ICD-10-CM | POA: Diagnosis not present

## 2023-10-01 DIAGNOSIS — F411 Generalized anxiety disorder: Secondary | ICD-10-CM | POA: Diagnosis not present

## 2023-10-01 DIAGNOSIS — Z6833 Body mass index (BMI) 33.0-33.9, adult: Secondary | ICD-10-CM

## 2023-10-01 MED ORDER — TOPIRAMATE 25 MG PO TABS
25.0000 mg | ORAL_TABLET | Freq: Every day | ORAL | 2 refills | Status: DC
Start: 2023-10-01 — End: 2024-08-18

## 2023-10-01 MED ORDER — ROSUVASTATIN CALCIUM 5 MG PO TABS
5.0000 mg | ORAL_TABLET | Freq: Every day | ORAL | 3 refills | Status: DC
Start: 2023-10-01 — End: 2024-09-27

## 2023-10-01 NOTE — Assessment & Plan Note (Signed)
Discussed alternative weight management medications including phentermine, topiramate, Wellbutrin.  Patient's mother is taking topiramate and she is open to a trial.  Starting at lowest dose once daily, topiramate 25 mg daily to help with cravings. Will continue to monitor.

## 2023-10-01 NOTE — Progress Notes (Signed)
Established Patient Office Visit  Subjective   Patient ID: Terry [name], female    DOB: Mar 30, 1981  Age: 42 y.o. MRN: [id number]  Chief Complaint  Patient presents with   Anxiety   Hyperlipidemia    HPI Terry [name] is a 42 y.o. female presenting today for follow up of hyperlipidemia, anxiety.  She would also like to discuss alternative weight management medications as Reginal Lutes is too expensive. Hyperlipidemia: tolerating rosuvastatin 5 mg 3 times weekly well with no myalgias or significant side effects. Currently consuming a low fat diet.  The 10-year ASCVD risk score (Arnett DK, et al., 2019) is: 0.8% Mood: Patient is here to follow up for anxiety, currently managing with Zoloft 100 mg daily and buspirone 2.5 mg twice daily. Taking medication without side effects, reports excellent compliance with treatment. Denies mood changes or SI/HI. She feels mood is stable since last visit.     03/12/2023    1:13 PM 01/29/2023    8:15 AM 09/03/2022    2:43 PM  Depression screen PHQ 2/9  Decreased Interest 0 0 0  Down, Depressed, Hopeless 0 0 0  PHQ - 2 Score 0 0 0  Altered sleeping 1  0  Tired, decreased energy 0  0  Change in appetite 0  0  Feeling bad or failure about yourself  0  0  Trouble concentrating 0  0  Moving slowly or fidgety/restless 0  0  Suicidal thoughts 0  0  PHQ-9 Score 1  0  Difficult doing work/chores Not difficult at all  Not difficult at all       03/12/2023    1:14 PM 09/03/2022    2:43 PM 07/10/2022    9:03 AM 05/14/2022   10:24 AM  GAD 7 : Generalized Anxiety Score  Nervous, Anxious, on Edge 0 0 0 0  Control/stop worrying 0 0 0 0  Worry too much - different things 1 0 0 0  Trouble relaxing 0 0 0 0  Restless 0 0 0 0  Easily annoyed or irritable 0 0 0 0  Afraid - awful might happen 0 0 0 0  Total GAD 7 Score 1 0 0 0  Anxiety Difficulty Not difficult at all Not difficult at all Not difficult at all Not difficult at all    Outpatient Medications Prior  to Visit  Medication Sig   atenolol (TENORMIN) 25 MG tablet Take 1 tablet (25 mg total) by mouth daily.   busPIRone (BUSPAR) 5 MG tablet TAKE ONE-HALF (1/2) TABLET IN THE MORNING AND 1 TABLET AT BEDTIME   gabapentin (NEURONTIN) 600 MG tablet TAKE 1 TABLET BY MOUTH AT BEDTIME.   loratadine (CLARITIN) 10 MG tablet Take 10 mg by mouth daily as needed for allergies.   ondansetron (ZOFRAN) 4 MG tablet TAKE 1 TABLET BY MOUTH EVERY 8 HOURS AS NEEDED FOR NAUSEA AND VOMITING   Semaglutide-Weight Management (WEGOVY) 2.4 MG/0.75ML SOAJ INJECT 2.4 MG INTO THE SKIN ONCE A WEEK.   sertraline (ZOLOFT) 100 MG tablet Take 1 tablet (100 mg total) by mouth daily.   [DISCONTINUED] rosuvastatin (CRESTOR) 5 MG tablet Take 1 tablet (5 mg total) by mouth 3 (three) times a week.   No facility-administered medications prior to visit.    ROS Negative unless otherwise noted in HPI   Objective:     BP 115/67 (BP Location: Left Arm, Patient Position: Sitting, Cuff Size: Normal)   Pulse 97   Resp 18   Ht 5\' 9"  (1.753  m)   Wt 175 lb (79.4 kg)   SpO2 93%   BMI 25.84 kg/m   Physical Exam  Assessment & Plan:  Mixed hyperlipidemia Assessment & Plan: Last lipid panel: LDL 126, HDL 53, triglycerides 236.  LDL improved significantly from 179 at last check, triglycerides increased.  Tolerating medication well, increased use to rosuvastatin 5 mg daily.  Will continue to monitor.  Orders: -     Rosuvastatin Calcium; Take 1 tablet (5 mg total) by mouth daily.  Dispense: 90 tablet; Refill: 3 -     Comprehensive metabolic panel; Future -     Lipid panel; Future  GAD (generalized anxiety disorder) Assessment & Plan: PHQ-9 score of 0, GAD-7 score of 0.  Stable.  Continue Zoloft 100 mg daily, buspirone 2.5 mg twice daily.  Orders: -     Comprehensive metabolic panel; Future  Class 1 obesity with serious comorbidity and body mass index (BMI) of 33.0 to 33.9 in adult, unspecified obesity type Assessment &  Plan: Discussed alternative weight management medications including phentermine, topiramate, Wellbutrin.  Patient's mother is taking topiramate and she is open to a trial.  Starting at lowest dose once daily, topiramate 25 mg daily to help with cravings. Will continue to monitor.  Orders: -     Topiramate; Take 1 tablet (25 mg total) by mouth daily.  Dispense: 30 tablet; Refill: 2 -     Comprehensive metabolic panel; Future  Screening for viral disease -     Hepatitis C antibody; Future    Return in about 6 weeks (around 11/12/2023) for follow-up for starting topiramate, in person or video, nonfasting labs to check kidneys 1 week prior.    Melida Quitter, PA

## 2023-10-01 NOTE — Patient Instructions (Addendum)
We will try a very low-dose of topiramate to help with cravings.  If it does not work well for you, I would recommend trying Wellbutrin next if we need to.  Start taking rosuvastatin for cholesterol daily.

## 2023-10-01 NOTE — Assessment & Plan Note (Signed)
PHQ-9 score of 0, GAD-7 score of 0.  Stable.  Continue Zoloft 100 mg daily, buspirone 2.5 mg twice daily.

## 2023-10-01 NOTE — Assessment & Plan Note (Addendum)
Last lipid panel: LDL 126, HDL 53, triglycerides 236.  LDL improved significantly from 179 at last check, triglycerides increased.  Tolerating medication well, increased use to rosuvastatin 5 mg daily.  Will continue to monitor.

## 2023-10-18 ENCOUNTER — Other Ambulatory Visit: Payer: Self-pay | Admitting: Internal Medicine

## 2023-10-23 ENCOUNTER — Other Ambulatory Visit: Payer: Self-pay | Admitting: Internal Medicine

## 2023-10-23 ENCOUNTER — Encounter: Payer: Self-pay | Admitting: Family Medicine

## 2023-10-23 DIAGNOSIS — I471 Supraventricular tachycardia, unspecified: Secondary | ICD-10-CM

## 2023-10-24 MED ORDER — ATENOLOL 25 MG PO TABS: 25.0000 mg | ORAL_TABLET | Freq: Every day | ORAL | 3 refills | Status: DC

## 2023-11-10 ENCOUNTER — Other Ambulatory Visit: Payer: Self-pay | Admitting: Family Medicine

## 2023-11-10 DIAGNOSIS — B0229 Other postherpetic nervous system involvement: Secondary | ICD-10-CM

## 2023-11-10 MED ORDER — GABAPENTIN 600 MG PO TABS
600.0000 mg | ORAL_TABLET | Freq: Every day | ORAL | 0 refills | Status: DC
Start: 2023-11-10 — End: 2024-02-11

## 2023-11-22 DIAGNOSIS — J101 Influenza due to other identified influenza virus with other respiratory manifestations: Secondary | ICD-10-CM | POA: Diagnosis not present

## 2023-11-23 ENCOUNTER — Encounter: Payer: Self-pay | Admitting: Family Medicine

## 2023-11-25 ENCOUNTER — Other Ambulatory Visit: Payer: BC Managed Care – PPO

## 2023-12-01 ENCOUNTER — Ambulatory Visit: Payer: BC Managed Care – PPO | Admitting: Family Medicine

## 2023-12-25 ENCOUNTER — Encounter: Payer: Self-pay | Admitting: Family Medicine

## 2023-12-29 ENCOUNTER — Encounter: Payer: Self-pay | Admitting: Family Medicine

## 2023-12-30 NOTE — Telephone Encounter (Signed)
Contacted pt and explained to her that we would be reaching out to reschedule her cancelled appt as close to the original as possible.

## 2024-01-07 ENCOUNTER — Encounter: Payer: Self-pay | Admitting: Family Medicine

## 2024-01-08 NOTE — Telephone Encounter (Signed)
Contacted pt and she stated that symptoms started Tuesday for her and her daughter.  She said her son tested positive as well but seems to be feeling better.  Please advise.

## 2024-01-14 ENCOUNTER — Encounter: Payer: Self-pay | Admitting: Family Medicine

## 2024-01-14 ENCOUNTER — Ambulatory Visit (INDEPENDENT_AMBULATORY_CARE_PROVIDER_SITE_OTHER): Payer: BC Managed Care – PPO | Admitting: Family Medicine

## 2024-01-14 ENCOUNTER — Ambulatory Visit: Payer: Self-pay | Admitting: Family Medicine

## 2024-01-14 VITALS — BP 109/71 | HR 100 | Ht 69.0 in | Wt 183.5 lb

## 2024-01-14 DIAGNOSIS — J019 Acute sinusitis, unspecified: Secondary | ICD-10-CM

## 2024-01-14 DIAGNOSIS — H109 Unspecified conjunctivitis: Secondary | ICD-10-CM | POA: Diagnosis not present

## 2024-01-14 DIAGNOSIS — B9689 Other specified bacterial agents as the cause of diseases classified elsewhere: Secondary | ICD-10-CM

## 2024-01-14 MED ORDER — AMOXICILLIN-POT CLAVULANATE 875-125 MG PO TABS
1.0000 | ORAL_TABLET | Freq: Two times a day (BID) | ORAL | 0 refills | Status: DC
Start: 2024-01-14 — End: 2024-08-18

## 2024-01-14 MED ORDER — ERYTHROMYCIN 5 MG/GM OP OINT
TOPICAL_OINTMENT | OPHTHALMIC | 0 refills | Status: DC
Start: 2024-01-14 — End: 2024-08-18

## 2024-01-14 NOTE — Telephone Encounter (Signed)
 Copied from CRM [id number]. Topic: Clinical - Red Word Triage >> Jan 14, 2024 12:13 PM Gildardo Pounds wrote: Red Word that prompted transfer to Nurse Triage: Patient has pink eye and it is getting worse in left eye. Callback number [phone]  Chief Complaint: Conjunctivitis  Symptoms: Drainage/discharge, redness Frequency: Today Pertinent Negatives: Patient denies fever Disposition: [] ED /[] Urgent Care (no appt availability in office) / [x] Appointment(In office/virtual)/ []  [facility]/ [] Home Care/ [] Refused Recommended Disposition /[] [facility]/ []  Follow-up with PCP Additional Notes: Patient called in to report redness and discharge from her left eye. Patient stated discharge is yellow. Patient stated left eye is noticeably more affected than right eye. Patient stated she had COVID last week and is still experiencing sinus symptoms, but denied fever. Patient denied swelling and pain. This RN advised patient to see a provider within 24 hours, per protocol. This RN scheduled same day appointment with PCP. This RN advised patient to call back if symptoms worsen. Patient complied.   Reason for Disposition  [1] Eye with yellow or green discharge, or eyelashes stick together AND [2] NO PCP standing order to call in antibiotic eye drops   (Exception: [place]; continue triage.)  Answer Assessment - Initial Assessment Questions 1. EYE DISCHARGE: "Is the discharge in one or both eyes?" "What color is it?" "How much is there?" "When did the discharge start?"      States left eye is worse than the right eye 2. REDNESS OF SCLERA: "Is the redness in one or both eyes?" "When did the redness start?"      Yes 3. EYELIDS: "Are the eyelids red or swollen?" If Yes, ask: "How much?"      Denies swelling 4. VISION: "Is there any difficulty seeing clearly?"      Denies 5. PAIN: "Is there any pain? If Yes, ask: "How bad is it?" (Scale 1-10; or mild, moderate, severe)    - MILD (1-3): doesn't  interfere with normal activities     - MODERATE (4-7): interferes with normal activities or awakens from sleep    - SEVERE (8-10): excruciating pain, unable to do any normal activities       Denies pain 6. CONTACT LENS: "Do you wear contacts?"     Denied 7. OTHER SYMPTOMS: "Do you have any other symptoms?" (e.g., fever, runny nose, cough)     Yellow discharge draining from left eye, itchiness in eyes, sinus symptoms, denies fever, tested positive for COVID last week  Protocols used: Eye - Pus or Discharge-A-AH

## 2024-01-14 NOTE — Progress Notes (Signed)
   Acute Office Visit  Subjective:     Patient ID: Terry [name], female    DOB: [birth year], 43 y.o.   MRN: [id number]  Chief Complaint  Patient presents with   Eye Problem    HPI Patient is in today for left eye issue.  For the past 2 days, patient has been experiencing left eye redness and discharge.  She had COVID 1 week ago and is still experiencing nasal congestion.  She describes the eye discharge as yellow with some eye itchiness.  ROS See HPI    Objective:    BP 109/71   Pulse 100   Ht 5\' 9"  (1.753 m)   Wt 183 lb 8 oz (83.2 kg)   LMP 12/20/2023   SpO2 97%   BMI 27.10 kg/m   Physical Exam Constitutional:      General: She is not in acute distress.    Appearance: Normal appearance. She is not ill-appearing.  HENT:     Head: Normocephalic and atraumatic.     Right Ear: Tympanic membrane, ear canal and external ear normal.     Left Ear: Tympanic membrane, ear canal and external ear normal.     Nose: Rhinorrhea present. No congestion.     Right Sinus: Maxillary sinus tenderness present. No frontal sinus tenderness.     Left Sinus: Maxillary sinus tenderness present. No frontal sinus tenderness.     Mouth/Throat:     Mouth: Mucous membranes are moist.     Pharynx: Oropharynx is clear. No oropharyngeal exudate or posterior oropharyngeal erythema.  Eyes:     General: Lids are normal.        Right eye: No foreign body, discharge or hordeolum.        Left eye: No foreign body, discharge or hordeolum.     Extraocular Movements:     Right eye: Normal extraocular motion and no nystagmus.     Left eye: Normal extraocular motion and no nystagmus.     Conjunctiva/sclera:     Right eye: Right conjunctiva is not injected.     Left eye: Left conjunctiva is injected.     Pupils: Pupils are equal, round, and reactive to light.  Pulmonary:     Effort: Pulmonary effort is normal.     Breath sounds: Normal breath sounds.  Neurological:     Mental Status: She is alert.        Assessment & Plan:  Acute bacterial rhinosinusitis -     Amoxicillin-Pot Clavulanate; Take 1 tablet by mouth 2 (two) times daily.  Dispense: 14 tablet; Refill: 0  Bacterial conjunctivitis of left eye -     Erythromycin; Apply 1 inch ribbon to affected eye TID for 5 days.  Dispense: 3.5 g; Refill: 0  Presentation consistent with bacterial rhinosinusitis and bacterial conjunctivitis of left eye.  Patient does not wear contacts, treat with erythromycin ophthalmic ointment.  Return if symptoms worsen or fail to improve.  Melida Quitter, PA

## 2024-01-19 ENCOUNTER — Other Ambulatory Visit: Payer: Self-pay | Admitting: Family Medicine

## 2024-01-19 DIAGNOSIS — F411 Generalized anxiety disorder: Secondary | ICD-10-CM

## 2024-02-10 ENCOUNTER — Other Ambulatory Visit: Payer: Self-pay | Admitting: Family Medicine

## 2024-02-10 DIAGNOSIS — B0229 Other postherpetic nervous system involvement: Secondary | ICD-10-CM

## 2024-03-04 DIAGNOSIS — H1045 Other chronic allergic conjunctivitis: Secondary | ICD-10-CM | POA: Diagnosis not present

## 2024-03-08 ENCOUNTER — Other Ambulatory Visit: Payer: BC Managed Care – PPO

## 2024-03-08 DIAGNOSIS — Z1159 Encounter for screening for other viral diseases: Secondary | ICD-10-CM

## 2024-03-08 DIAGNOSIS — F411 Generalized anxiety disorder: Secondary | ICD-10-CM

## 2024-03-08 DIAGNOSIS — E66811 Obesity, class 1: Secondary | ICD-10-CM | POA: Diagnosis not present

## 2024-03-08 DIAGNOSIS — E782 Mixed hyperlipidemia: Secondary | ICD-10-CM

## 2024-03-09 LAB — COMPREHENSIVE METABOLIC PANEL WITH GFR
ALT: 13 IU/L (ref 0–32)
AST: 17 IU/L (ref 0–40)
Albumin: 4.4 g/dL (ref 3.9–4.9)
Alkaline Phosphatase: 62 IU/L (ref 44–121)
BUN/Creatinine Ratio: 8 — ABNORMAL LOW (ref 9–23)
BUN: 7 mg/dL (ref 6–24)
Bilirubin Total: 0.3 mg/dL (ref 0.0–1.2)
CO2: 24 mmol/L (ref 20–29)
Calcium: 9.4 mg/dL (ref 8.7–10.2)
Chloride: 106 mmol/L (ref 96–106)
Creatinine, Ser: 0.85 mg/dL (ref 0.57–1.00)
Globulin, Total: 2.2 g/dL (ref 1.5–4.5)
Glucose: 77 mg/dL (ref 70–99)
Potassium: 4.5 mmol/L (ref 3.5–5.2)
Sodium: 143 mmol/L (ref 134–144)
Total Protein: 6.6 g/dL (ref 6.0–8.5)
eGFR: 86 mL/min/{1.73_m2} (ref >59)

## 2024-03-09 LAB — LIPID PANEL
Chol/HDL Ratio: 3.5 ratio (ref 0.0–4.4)
Cholesterol, Total: 149 mg/dL (ref 100–199)
HDL: 42 mg/dL (ref >39)
LDL Chol Calc (NIH): 85 mg/dL (ref 0–99)
Triglycerides: 120 mg/dL (ref 0–149)
VLDL Cholesterol Cal: 22 mg/dL (ref 5–40)

## 2024-03-09 LAB — HEPATITIS C ANTIBODY: Hep C Virus Ab: NONREACTIVE

## 2024-03-10 ENCOUNTER — Encounter: Payer: Self-pay | Admitting: Family Medicine

## 2024-03-11 ENCOUNTER — Encounter: Payer: Self-pay | Admitting: Obstetrics and Gynecology

## 2024-03-11 ENCOUNTER — Other Ambulatory Visit (HOSPITAL_COMMUNITY)
Admission: RE | Admit: 2024-03-11 | Discharge: 2024-03-11 | Disposition: A | Discharge status: Home / Self Care | Source: Ambulatory Visit | Attending: Obstetrics and Gynecology | Admitting: Obstetrics and Gynecology

## 2024-03-11 ENCOUNTER — Ambulatory Visit (INDEPENDENT_AMBULATORY_CARE_PROVIDER_SITE_OTHER): Payer: BC Managed Care – PPO | Admitting: Obstetrics and Gynecology

## 2024-03-11 VITALS — BP 103/73 | HR 85 | Ht 69.0 in | Wt 176.3 lb

## 2024-03-11 DIAGNOSIS — Z1339 Encounter for screening examination for other mental health and behavioral disorders: Secondary | ICD-10-CM

## 2024-03-11 DIAGNOSIS — Z01419 Encounter for gynecological examination (general) (routine) without abnormal findings: Secondary | ICD-10-CM | POA: Insufficient documentation

## 2024-03-11 NOTE — Progress Notes (Signed)
 Subjective:     Terry [name] is a 43 y.o. female P2 with LMP 02/16/24 and BMI 26 who is here for a comprehensive physical exam. The patient reports no problems. She reports a monthly period lasting 4-5 days. She denies pelvic pain or abnormal discharge. She is sexually active using vasectomy for contraception. She denies urinary incontinence or issues with bowel movements. Patient is without any complaints. Patient reports a history of hot flashes and severe night sweat which is well controlled with gabapentin   Past Medical History:  Diagnosis Date   Anxiety    Celiac disease    IBS (irritable bowel syndrome)    Lipids abnormal    Migraine    Mononucleosis    Paroxysmal SVT (supraventricular tachycardia) (HCC)    Tachycardia    TMJ disease    Urinary incontinence    after sneezing    Past Surgical History:  Procedure Laterality Date   ABDOMINOPLASTY     BREAST BIOPSY Right 02/2020   CESAREAN SECTION     LAPAROSCOPY     TOOTH EXTRACTION     Family History  Problem Relation Age of Onset   Hypertension Other    Aneurysm Maternal Grandmother    Prostate cancer Maternal Grandfather    Heart attack Paternal Grandmother    Colon cancer Neg Hx    Stomach cancer Neg Hx    Rectal cancer Neg Hx    Esophageal cancer Neg Hx    Liver cancer Neg Hx     Social History   Socioeconomic History   Marital status: Married    Spouse name: Not on file   Number of children: 2   Years of education: Not on file   Highest education level: Associate degree: occupational, Scientist, product/process development, or vocational program  Occupational History   Occupation: Risk analyst  Tobacco Use   Smoking status: Never    Passive exposure: Never   Smokeless tobacco: Never  Vaping Use   Vaping status: Never Used  Substance and Sexual Activity   Alcohol use: Yes    Alcohol/week: 12.0 standard drinks of alcohol    Types: 12 Standard drinks or equivalent per week   Drug use: No   Sexual activity: Yes    Partners:  Male    Birth control/protection: Surgical    Comment: vasectomy  Other Topics Concern   Not on file  Social History Narrative   Not on file   Social Drivers of Health   Financial Resource Strain: Low Risk  (01/14/2024)   Overall Financial Resource Strain (CARDIA)    Difficulty of Paying Living Expenses: Not hard at all  Food Insecurity: No Food Insecurity (01/14/2024)   Hunger Vital Sign    Worried About Running Out of Food in the Last Year: Never true    Ran Out of Food in the Last Year: Never true  Transportation Needs: No Transportation Needs (01/14/2024)   PRAPARE - Administrator, Civil Service (Medical): No    Lack of Transportation (Non-Medical): No  Physical Activity: Insufficiently Active (01/14/2024)   Exercise Vital Sign    Days of Exercise per Week: 5 days    Minutes of Exercise per Session: 20 min  Stress: No Stress Concern Present (01/14/2024)   [employer] of Occupational Health - Occupational Stress Questionnaire    Feeling of Stress : Not at all  Social Connections: Socially Integrated (01/14/2024)   Social Connection and Isolation Panel [NHANES]    Frequency of Communication with Friends and  Family: Three times a week    Frequency of Social Gatherings with Friends and Family: Three times a week    Attends Religious Services: More than 4 times per year    Active Member of Clubs or Organizations: Yes    Attends Banker Meetings: More than 4 times per year    Marital Status: Married  Catering manager Violence: Not At Risk (03/12/2023)   Humiliation, Afraid, Rape, and Kick questionnaire    Fear of Current or Ex-Partner: No    Emotionally Abused: No    Physically Abused: No    Sexually Abused: No   Health Maintenance  Topic Date Due   COVID-19 Vaccine (5 - 2024-25 season) 07/20/2023   Colonoscopy  Never done   MAMMOGRAM  04/02/2024   INFLUENZA VACCINE  06/18/2024   Cervical Cancer Screening (HPV/Pap Cotest)  02/27/2025    DTaP/Tdap/Td (2 - Td or Tdap) 09/02/2029   Hepatitis C Screening  Completed   HIV Screening  Completed   HPV VACCINES  Aged Out   Meningococcal B Vaccine  Aged Out       Review of Systems Pertinent items noted in HPI and remainder of comprehensive ROS otherwise negative.   Objective:  Blood pressure 103/73, pulse 85, height 5\' 9"  (1.753 m), weight 176 lb 4.8 oz (80 kg), last menstrual period 02/16/2024.   GENERAL: Well-developed, well-nourished female in no acute distress.  HEENT: Normocephalic, atraumatic. Sclerae anicteric.  NECK: Supple. Normal thyroid .  LUNGS: Clear to auscultation bilaterally.  HEART: Regular rate and rhythm. BREASTS: Symmetric in size. No palpable masses or lymphadenopathy, skin changes, or nipple drainage. ABDOMEN: Soft, nontender, nondistended. No organomegaly. PELVIC: Normal external female genitalia. Vagina is pink and rugated.  Normal discharge. Normal appearing cervix. Uterus is normal in size. No adnexal mass or tenderness. Chaperone present during the pelvic exam EXTREMITIES: No cyanosis, clubbing, or edema, 2+ distal pulses.     Assessment:    Healthy female exam.      Plan:    Pap smear collected Normal health maintenance labs earlier this month Screening mammogram due next month- order placed Patient opted for colon cancer screening with cologuard Patient declined STI screening Patient will be contacted with abnormal results See After Visit Summary for Counseling Recommendations

## 2024-03-11 NOTE — Progress Notes (Signed)
 l

## 2024-03-15 ENCOUNTER — Encounter: Payer: BC Managed Care – PPO | Admitting: Family Medicine

## 2024-03-16 LAB — CYTOLOGY - PAP
Comment: NEGATIVE
Diagnosis: NEGATIVE
High risk HPV: NEGATIVE

## 2024-04-05 DIAGNOSIS — Z1211 Encounter for screening for malignant neoplasm of colon: Secondary | ICD-10-CM | POA: Diagnosis not present

## 2024-04-06 ENCOUNTER — Ambulatory Visit
Admission: RE | Admit: 2024-04-06 | Discharge: 2024-04-06 | Disposition: A | Discharge status: Home / Self Care | Source: Ambulatory Visit | Attending: Obstetrics and Gynecology | Admitting: Obstetrics and Gynecology

## 2024-04-06 DIAGNOSIS — Z1231 Encounter for screening mammogram for malignant neoplasm of breast: Secondary | ICD-10-CM | POA: Diagnosis not present

## 2024-04-06 DIAGNOSIS — Z01419 Encounter for gynecological examination (general) (routine) without abnormal findings: Secondary | ICD-10-CM

## 2024-04-12 LAB — COLOGUARD: COLOGUARD: NEGATIVE

## 2024-04-13 ENCOUNTER — Other Ambulatory Visit: Payer: Self-pay | Admitting: Medical Genetics

## 2024-05-03 ENCOUNTER — Other Ambulatory Visit: Payer: Self-pay | Admitting: Family Medicine

## 2024-05-03 DIAGNOSIS — B0229 Other postherpetic nervous system involvement: Secondary | ICD-10-CM

## 2024-05-13 ENCOUNTER — Telehealth: Payer: Self-pay | Admitting: Family Medicine

## 2024-05-13 DIAGNOSIS — F411 Generalized anxiety disorder: Secondary | ICD-10-CM

## 2024-05-13 MED ORDER — SERTRALINE HCL 100 MG PO TABS
100.0000 mg | ORAL_TABLET | Freq: Every day | ORAL | 0 refills | Status: AC
Start: 2024-05-13 — End: 2024-05-28

## 2024-05-13 NOTE — Telephone Encounter (Signed)
 Copied from CRM [id number]. Topic: Clinical - Medication Refill >> May 13, 2024 10:03 AM Emylou G wrote: Medication: sertraline  (ZOLOFT ) 100 MG tablet  Patient is out of town and forgot meds.. can she get one week fill out of town  Has the patient contacted their pharmacy? No (Agent: If no, request that the patient contact the pharmacy for the refill. If patient does not wish to contact the pharmacy document the reason why and proceed with request.) (Agent: If yes, when and what did the pharmacy advise?)  This is the patient's preferred pharmacy:  [facility] [address] [place], [place] [address] [phone]  Is this the correct pharmacy for this prescription? Yes If no, delete pharmacy and type the correct one.   Has the prescription been filled recently? Yes  Is the patient out of the medication? No  Has the patient been seen for an appointment in the last year OR does the patient have an upcoming appointment? Yes  Can we respond through MyChart? Yes  Agent: Please be advised that Rx refills may take up to 3 business days. We ask that you follow-up with your pharmacy.

## 2024-06-07 ENCOUNTER — Ambulatory Visit

## 2024-07-21 ENCOUNTER — Other Ambulatory Visit

## 2024-07-30 ENCOUNTER — Other Ambulatory Visit: Payer: Self-pay

## 2024-07-30 DIAGNOSIS — B0229 Other postherpetic nervous system involvement: Secondary | ICD-10-CM

## 2024-08-11 ENCOUNTER — Other Ambulatory Visit: Payer: Self-pay | Admitting: *Deleted

## 2024-08-11 ENCOUNTER — Other Ambulatory Visit

## 2024-08-11 DIAGNOSIS — E559 Vitamin D deficiency, unspecified: Secondary | ICD-10-CM

## 2024-08-11 DIAGNOSIS — R946 Abnormal results of thyroid function studies: Secondary | ICD-10-CM

## 2024-08-11 DIAGNOSIS — Z131 Encounter for screening for diabetes mellitus: Secondary | ICD-10-CM

## 2024-08-11 DIAGNOSIS — E782 Mixed hyperlipidemia: Secondary | ICD-10-CM | POA: Diagnosis not present

## 2024-08-11 DIAGNOSIS — E785 Hyperlipidemia, unspecified: Secondary | ICD-10-CM

## 2024-08-12 ENCOUNTER — Ambulatory Visit: Payer: Self-pay | Admitting: Family Medicine

## 2024-08-12 LAB — COMPREHENSIVE METABOLIC PANEL WITH GFR
ALT: 12 IU/L (ref 0–32)
AST: 19 IU/L (ref 0–40)
Albumin: 4.4 g/dL (ref 3.9–4.9)
Alkaline Phosphatase: 60 IU/L (ref 41–116)
BUN/Creatinine Ratio: 14 (ref 9–23)
BUN: 10 mg/dL (ref 6–24)
Bilirubin Total: 0.5 mg/dL (ref 0.0–1.2)
CO2: 22 mmol/L (ref 20–29)
Calcium: 9.5 mg/dL (ref 8.7–10.2)
Chloride: 102 mmol/L (ref 96–106)
Creatinine, Ser: 0.74 mg/dL (ref 0.57–1.00)
Globulin, Total: 2.3 g/dL (ref 1.5–4.5)
Glucose: 80 mg/dL (ref 70–99)
Potassium: 4.5 mmol/L (ref 3.5–5.2)
Sodium: 139 mmol/L (ref 134–144)
Total Protein: 6.7 g/dL (ref 6.0–8.5)
eGFR: 102 mL/min/1.73 (ref >59)

## 2024-08-12 LAB — CBC WITH DIFFERENTIAL/PLATELET
Basophils Absolute: 0.1 x10E3/uL (ref 0.0–0.2)
Basos: 1 %
EOS (ABSOLUTE): 0.1 x10E3/uL (ref 0.0–0.4)
Eos: 1 %
Hematocrit: 41.2 % (ref 34.0–46.6)
Hemoglobin: 13.6 g/dL (ref 11.1–15.9)
Immature Grans (Abs): 0 x10E3/uL (ref 0.0–0.1)
Immature Granulocytes: 0 %
Lymphocytes Absolute: 1.6 x10E3/uL (ref 0.7–3.1)
Lymphs: 37 %
MCH: 31.2 pg (ref 26.6–33.0)
MCHC: 33 g/dL (ref 31.5–35.7)
MCV: 95 fL (ref 79–97)
Monocytes Absolute: 0.4 x10E3/uL (ref 0.1–0.9)
Monocytes: 9 %
Neutrophils Absolute: 2.2 x10E3/uL (ref 1.4–7.0)
Neutrophils: 52 %
Platelets: 268 x10E3/uL (ref 150–450)
RBC: 4.36 x10E6/uL (ref 3.77–5.28)
RDW: 12.1 % (ref 11.7–15.4)
WBC: 4.4 x10E3/uL (ref 3.4–10.8)

## 2024-08-12 LAB — VITAMIN D 25 HYDROXY (VIT D DEFICIENCY, FRACTURES): Vit D, 25-Hydroxy: 105 ng/mL — ABNORMAL HIGH (ref 30.0–100.0)

## 2024-08-12 LAB — LIPID PANEL
Chol/HDL Ratio: 3.6 ratio (ref 0.0–4.4)
Cholesterol, Total: 157 mg/dL (ref 100–199)
HDL: 44 mg/dL (ref >39)
LDL Chol Calc (NIH): 95 mg/dL (ref 0–99)
Triglycerides: 99 mg/dL (ref 0–149)
VLDL Cholesterol Cal: 18 mg/dL (ref 5–40)

## 2024-08-12 LAB — HEMOGLOBIN A1C
Est. average glucose Bld gHb Est-mCnc: 97 mg/dL
Hgb A1c MFr Bld: 5 % (ref 4.8–5.6)

## 2024-08-12 LAB — TSH: TSH: 2.32 u[IU]/mL (ref 0.450–4.500)

## 2024-08-18 ENCOUNTER — Ambulatory Visit: Admitting: Family Medicine

## 2024-08-18 ENCOUNTER — Encounter: Payer: Self-pay | Admitting: Family Medicine

## 2024-08-18 VITALS — BP 100/67 | HR 90 | Ht 69.0 in | Wt 166.0 lb

## 2024-08-18 DIAGNOSIS — E66811 Obesity, class 1: Secondary | ICD-10-CM | POA: Diagnosis not present

## 2024-08-18 DIAGNOSIS — Z0000 Encounter for general adult medical examination without abnormal findings: Secondary | ICD-10-CM | POA: Diagnosis not present

## 2024-08-18 DIAGNOSIS — F411 Generalized anxiety disorder: Secondary | ICD-10-CM

## 2024-08-18 DIAGNOSIS — Z6833 Body mass index (BMI) 33.0-33.9, adult: Secondary | ICD-10-CM

## 2024-08-18 DIAGNOSIS — E559 Vitamin D deficiency, unspecified: Secondary | ICD-10-CM

## 2024-08-18 NOTE — Patient Instructions (Addendum)
 It was nice to see you today,  We addressed the following topics today: -If you look into [facility] in [place] and find out how to have your prescription sent to them I can send in prescriptions for Zepbound to them. - It is okay to take 2000 units of vitamin D  every day. - If you want to stop taking the BuSpar , try taking it every other night for 2 weeks before stopping or cut it in half for 2 weeks before stopping.  Have a great day,  Rolan Slain, MD

## 2024-08-18 NOTE — Progress Notes (Unsigned)
 Annual physical  Subjective    Patient ID: Terry [name], female    DOB: Mar 20, 1981  Age: 43 y.o. MRN: [id number]  Chief Complaint  Patient presents with   Annual Exam   HPI Girtha is a 43 y.o. old female here  for annual exam.   Subjective - Follow-up for annual physical. Discussed medications and ongoing health maintenance. - Reports switching from semaglutide  to compounded tirzepatide (Zepbound) for weight management due to insurance no longer covering Wegovy . The compounded medication is obtained from [facility] pharmacy via a local med spa. States they have been on this type of medication for about two years. Initially lost 30 pounds on Wegovy , then regained weight after stopping, and has since resumed treatment. Current dose is 0.75 mL (equivalent to 7.5 mg) injected weekly. Discussed the possibility of obtaining the prescription through this practice, potentially from a local compounding pharmacy like [facility], and will message with information on how to send the prescription. - Reports vitamin D  level was high on recent bloodwork. Was taking 5,000 IU daily over-the-counter. - Reports taking gabapentin  for severe night sweats, which has resolved the symptoms completely. - Reports history of panic attacks, particularly after weaning their daughter about 13 years ago, for which sertraline  was started. - Discussed buspirone , currently taking once daily at night. Considers discontinuing it as they feel well.  Medications Current medications include: compounded tirzepatide 0.75 mL (17 mg/2 mL vial) weekly for weight maintenance, atenolol , buspirone  once daily, gabapentin  for night sweats, loratadine  (Claritin ) or fexofenadine (Allegra) for allergies, rosuvastatin  (Crestor ), sertraline  for panic attacks, and vitamin D  5,000 IU daily (dose to be adjusted).  PMH, PSH, FH, Social Hx PMHx: Obesity, history of supraventricular tachycardia (SVT) with one ER visit for heart rate >200 in the past,  celiac disease, history of panic attacks. PSH: No surgeries mentioned. FH: No family history of colon cancer. Social Hx: Denies tobacco or alcohol use. 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  ROS Constitutional: Denies sleep apnea. Reports history of severe night sweats, now resolved with gabapentin . Cardiovascular: Denies current cardiovascular disease. History of SVT. GI: Denies fatty liver disease. History of celiac disease. Endocrine: Reports weight loss with GLP-1 agonists. Psych: History of panic attacks, currently controlled.   The 10-year ASCVD risk score (Arnett DK, et al., 2019) is: 0.5%  Health Maintenance Due  Topic Date Due   Hepatitis B Vaccines 19-59 Average Risk (1 of 3 - 19+ 3-dose series) Never done   HPV VACCINES (1 - 3-dose SCDM series) Never done   Colonoscopy  Never done   COVID-19 Vaccine (5 - 2025-26 season) 07/19/2024      Objective:     BP 100/67   Pulse 90   Ht 5' 9 (1.753 m)   Wt 166 lb (75.3 kg)   LMP 07/31/2024   SpO2 97%   BMI 24.51 kg/m  {Vitals History (Optional):23777}  Physical Exam Gen: alert, oriented HEENT: perrla, eomi, mmm CV: rrr, no murmur Pulm: lctab. No wheeze or crackles.  GI: soft, nbs.  Nontender to palpation MSK: strength equal b/l. Normal gait Ext: no pedal edema Skin: warm and dry, no rashes Psych: pleasant affect.  Spontaneous speech   No results found for any visits on 08/18/24.      Assessment & Plan:   Physical exam, annual  Class 1 obesity with serious comorbidity and body mass index (BMI) of 33.0 to 33.9 in adult, unspecified obesity type Assessment & Plan: - Stable on compounded tirzepatide for weight  maintenance. Lost approximately 30 pounds previously on Wegovy , which was stopped due to insurance coverage issues, leading to weight regain. Now receives compounded tirzepatide (Zepbound) from a med spa, sourced from [employer]. Cost is approximately $550 for a 7-8 week supply. No current sleep  apnea, cardiovascular disease, or fatty liver disease. - Will investigate prescribing compounded tirzepatide through a local pharmacy, [facility]. - Patient will provide information on how to send the prescription. - Discussed other options including Weight Watchers program partnership with manufacturers, which they have already explored and found to be more expensive.   Vitamin D  deficiency Assessment & Plan: - Level was high on recent labs while taking 5,000 IU daily. - Advised to decrease vitamin D  dose to 2,000 IU daily.   GAD (generalized anxiety disorder) Assessment & Plan: - History of panic attacks. Well-controlled on sertraline . - Takes buspirone  once daily at night but feels they may no longer need it. - Discussed tapering off buspirone  by taking it every other night or taking a half tablet for a couple of weeks before stopping.      Return in about 1 year (around 08/18/2025) for physical.    Toribio MARLA Slain, MD

## 2024-08-18 NOTE — Assessment & Plan Note (Signed)
-   Level was high on recent labs while taking 5,000 IU daily. - Advised to decrease vitamin D  dose to 2,000 IU daily.

## 2024-08-18 NOTE — Assessment & Plan Note (Signed)
-   Stable on compounded tirzepatide for weight maintenance. Lost approximately 30 pounds previously on Wegovy , which was stopped due to insurance coverage issues, leading to weight regain. Now receives compounded tirzepatide (Zepbound) from a med spa, sourced from [employer]. Cost is approximately $550 for a 7-8 week supply. No current sleep apnea, cardiovascular disease, or fatty liver disease. - Will investigate prescribing compounded tirzepatide through a local pharmacy, [facility]. - Patient will provide information on how to send the prescription. - Discussed other options including Weight Watchers program partnership with manufacturers, which they have already explored and found to be more expensive.

## 2024-08-18 NOTE — Assessment & Plan Note (Signed)
-   History of panic attacks. Well-controlled on sertraline . - Takes buspirone  once daily at night but feels they may no longer need it. - Discussed tapering off buspirone  by taking it every other night or taking a half tablet for a couple of weeks before stopping.

## 2024-09-20 ENCOUNTER — Other Ambulatory Visit: Payer: Self-pay | Admitting: Medical Genetics

## 2024-09-20 DIAGNOSIS — Z006 Encounter for examination for normal comparison and control in clinical research program: Secondary | ICD-10-CM

## 2024-09-27 ENCOUNTER — Other Ambulatory Visit: Payer: Self-pay | Admitting: Family Medicine

## 2024-09-27 DIAGNOSIS — E782 Mixed hyperlipidemia: Secondary | ICD-10-CM

## 2024-10-18 ENCOUNTER — Other Ambulatory Visit: Payer: Self-pay

## 2024-10-18 DIAGNOSIS — B0229 Other postherpetic nervous system involvement: Secondary | ICD-10-CM

## 2024-10-27 ENCOUNTER — Other Ambulatory Visit: Payer: Self-pay | Admitting: Family Medicine

## 2024-10-27 DIAGNOSIS — F411 Generalized anxiety disorder: Secondary | ICD-10-CM

## 2024-10-27 DIAGNOSIS — I471 Supraventricular tachycardia, unspecified: Secondary | ICD-10-CM

## 2024-10-27 DIAGNOSIS — F410 Panic disorder [episodic paroxysmal anxiety] without agoraphobia: Secondary | ICD-10-CM

## 2024-11-01 ENCOUNTER — Other Ambulatory Visit: Payer: Self-pay | Admitting: Family Medicine

## 2024-11-01 ENCOUNTER — Encounter: Payer: Self-pay | Admitting: Family Medicine

## 2024-11-01 MED ORDER — TIRZEPATIDE-WEIGHT MANAGEMENT 10 MG/0.5ML ~~LOC~~ SOLN: 10.0000 mg | SUBCUTANEOUS | 2 refills | Status: DC

## 2024-11-03 ENCOUNTER — Other Ambulatory Visit: Payer: Self-pay | Admitting: Family Medicine

## 2024-11-03 MED ORDER — TIRZEPATIDE-WEIGHT MANAGEMENT 15 MG/0.5ML ~~LOC~~ SOLN: 15.0000 mg | SUBCUTANEOUS | 2 refills | Status: AC

## 2025-08-17 ENCOUNTER — Other Ambulatory Visit

## 2025-08-24 ENCOUNTER — Encounter: Admitting: Family Medicine
